# Patient Record
Sex: Male | Born: 1975 | Race: Black or African American | Hispanic: No | Marital: Single | Smoking: Current every day smoker
Health system: Southern US, Community
[De-identification: ages and names within clinical notes are randomized; demographics above are authoritative.]

---
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
## 1998-02-28 ENCOUNTER — Emergency Department (HOSPITAL_COMMUNITY): Admission: EM | Admit: 1998-02-28 | Discharge: 1998-02-28 | Payer: Self-pay | Admitting: Emergency Medicine

## 1998-03-13 ENCOUNTER — Emergency Department (HOSPITAL_COMMUNITY): Admission: EM | Admit: 1998-03-13 | Discharge: 1998-03-13 | Payer: Self-pay | Admitting: *Deleted

## 2010-12-24 ENCOUNTER — Inpatient Hospital Stay (INDEPENDENT_AMBULATORY_CARE_PROVIDER_SITE_OTHER)
Admission: RE | Admit: 2010-12-24 | Discharge: 2010-12-24 | Disposition: A | Discharge status: Home / Self Care | Payer: Managed Care, Other (non HMO) | Source: Ambulatory Visit | Attending: Family Medicine | Admitting: Family Medicine

## 2010-12-24 ENCOUNTER — Ambulatory Visit (INDEPENDENT_AMBULATORY_CARE_PROVIDER_SITE_OTHER): Payer: Managed Care, Other (non HMO)

## 2010-12-24 ENCOUNTER — Ambulatory Visit (HOSPITAL_COMMUNITY): Payer: Managed Care, Other (non HMO)

## 2010-12-24 DIAGNOSIS — S20229A Contusion of unspecified back wall of thorax, initial encounter: Secondary | ICD-10-CM

## 2013-12-13 ENCOUNTER — Emergency Department (INDEPENDENT_AMBULATORY_CARE_PROVIDER_SITE_OTHER): Payer: Managed Care, Other (non HMO)

## 2013-12-13 ENCOUNTER — Encounter (HOSPITAL_COMMUNITY): Payer: Self-pay | Admitting: Emergency Medicine

## 2013-12-13 ENCOUNTER — Emergency Department (INDEPENDENT_AMBULATORY_CARE_PROVIDER_SITE_OTHER)
Admission: EM | Admit: 2013-12-13 | Discharge: 2013-12-13 | Disposition: A | Discharge status: Home / Self Care | Payer: Managed Care, Other (non HMO) | Source: Home / Self Care | Attending: Family Medicine | Admitting: Family Medicine

## 2013-12-13 DIAGNOSIS — B349 Viral infection, unspecified: Secondary | ICD-10-CM

## 2013-12-13 DIAGNOSIS — R071 Chest pain on breathing: Secondary | ICD-10-CM

## 2013-12-13 DIAGNOSIS — B9789 Other viral agents as the cause of diseases classified elsewhere: Secondary | ICD-10-CM

## 2013-12-13 DIAGNOSIS — R0781 Pleurodynia: Secondary | ICD-10-CM

## 2013-12-13 MED ORDER — KETOROLAC TROMETHAMINE 60 MG/2ML IM SOLN
60.0000 mg | Freq: Once | INTRAMUSCULAR | Status: AC
  Administered 2013-12-13: 60 mg via INTRAMUSCULAR

## 2013-12-13 MED ORDER — KETOROLAC TROMETHAMINE 60 MG/2ML IM SOLN
INTRAMUSCULAR | Status: AC
  Filled 2013-12-13: qty 2

## 2013-12-13 MED ORDER — PREDNISONE (PAK) 10 MG PO TABS: ORAL_TABLET | Freq: Every day | ORAL | Status: DC

## 2013-12-13 NOTE — Discharge Instructions (Signed)
Pleurisy Pleurisy is an inflammation and swelling of the lining of the lungs (pleura). Because of this inflammation, it hurts to breathe. It can be aggravated by coughing, laughing, or deep breathing. Pleurisy is often caused by an underlying infection or disease.  HOME CARE INSTRUCTIONS  Monitor your pleurisy for any changes. The following actions may help to alleviate any discomfort you are experiencing:  Medicine may help with pain. Only take over-the-counter or prescription medicines for pain, discomfort, or fever as directed by your health care provider.  Only take antibiotic medicine as directed. Make sure to finish it even if you start to feel better. SEEK MEDICAL CARE IF:   Your pain is not controlled with medicine or is increasing.  You have an increase in pus-like (purulent) secretions brought up with coughing. SEEK IMMEDIATE MEDICAL CARE IF:   You have blue or dark lips, fingernails, or toenails.  You are coughing up blood.  You have increased difficulty breathing.  You have continuing pain unrelieved by medicine or pain lasting more than 1 week.  You have pain that radiates into your neck, arms, or jaw.  You develop increased shortness of breath or wheezing.  You develop a fever, rash, vomiting, fainting, or other serious symptoms. MAKE SURE YOU:  Understand these instructions.   Will watch your condition.   Will get help right away if you are not doing well or get worse.  Document Released: 10/10/2005 Document Revised: 06/12/2013 Document Reviewed: 03/24/2013 [facility] Patient Information 2014 [place], [place].   STart pred Pack tomorrow morning. REst and fluids. Should the pain worsen, then please f/u.

## 2013-12-13 NOTE — ED Provider Notes (Signed)
CSN: [id number]     Arrival date & time 12/13/13  1836 History   First MD Initiated Contact with Patient 12/13/13 1910     Chief Complaint  Patient presents with  . Chest Pain  . Shortness of Breath     (Consider location/radiation/quality/duration/timing/severity/associated sxs/prior Treatment) HPI Comments: Patient presents with inspiratory left sided chest pain. Described as "sharp". Mild SOB. No cough or congestion. No fever or chills. No prior history of Asthma. Previous cough, and nasal congestion over the last few days preceding. No N, V. No radicular pain. No weakness. No palpitations.   Patient is a 38 y.o. male presenting with chest pain and shortness of breath. The history is provided by the patient.  Chest Pain Associated symptoms: cough, fatigue and shortness of breath   Associated symptoms: no fever and no palpitations   Shortness of Breath Associated symptoms: chest pain, cough and wheezing   Associated symptoms: no fever     History reviewed. No pertinent past medical history. History reviewed. No pertinent past surgical history. No family history on file. History  Substance Use Topics  . Smoking status: Current Every Day Smoker  . Smokeless tobacco: Not on file  . Alcohol Use: Yes    Review of Systems  Constitutional: Positive for fatigue. Negative for fever and chills.  HENT: Positive for rhinorrhea.   Eyes: Negative.   Respiratory: Positive for cough, shortness of breath and wheezing. Negative for apnea and chest tightness.   Cardiovascular: Positive for chest pain. Negative for palpitations and leg swelling.  Gastrointestinal: Negative.   Genitourinary: Negative.   Skin: Negative.   Allergic/Immunologic: Negative.   Neurological: Negative.   Psychiatric/Behavioral: Negative.       Allergies  Review of patient's allergies indicates no known allergies.  Home Medications   Current Outpatient Rx  Name  Route  Sig  Dispense  Refill  . guaifenesin  (ROBITUSSIN) 100 MG/5ML syrup   Oral   Take 200 mg by mouth 3 (three) times daily as needed for cough.         . predniSONE (STERAPRED UNI-PAK) 10 MG tablet   Oral   Take by mouth daily. 10mg  12 day pack as directed   1 tablet   0    BP 102/55  Pulse 86  Temp(Src) 97.8 F (36.6 C) (Oral)  Resp 18  SpO2 100% Physical Exam  Nursing note and vitals reviewed. Constitutional: He is oriented to person, place, and time. He appears well-developed and well-nourished. No distress.  HENT:  Head: Normocephalic and atraumatic.  Mouth/Throat: No oropharyngeal exudate.  Eyes: Pupils are equal, round, and reactive to light.  Neck: Normal range of motion. Neck supple. No JVD present. No tracheal deviation present.  Cardiovascular: Normal rate, regular rhythm and normal heart sounds.  Exam reveals no gallop and no friction rub.   No murmur heard. Pulmonary/Chest: Effort normal. No respiratory distress. He has wheezes. He has no rales. He exhibits no tenderness.  Wheezes in the right base, and few rhonci  Abdominal: Soft. Bowel sounds are normal.  Lymphadenopathy:    He has no cervical adenopathy.  Neurological: He is alert and oriented to person, place, and time. No cranial nerve deficit.  Skin: He is not diaphoretic.    ED Course  Procedures (including critical care time) Labs Review Labs Reviewed - No data to display Imaging Review Dg Chest 2 View  12/13/2013   CLINICAL DATA:  Cough and left chest pain  EXAM: CHEST  2 VIEW  COMPARISON:  None.  FINDINGS: The heart size and mediastinal contours are within normal limits. Both lungs are clear. The visualized skeletal structures are unremarkable.  IMPRESSION: No active cardiopulmonary disease.   Electronically Signed   By: Ruel Favors M.D.   On: 12/13/2013 19:43      MDM   Final diagnoses:  Pleuritic chest pain  Viral syndrome    EKG-Normal, CXR-Normal. O2 sats 100%-Most likely pleuritic chest pain in the setting of a viral URI.  Educated. If worsens please return for evaluation.  Toradol given for acute pain followed by a Pred pack to start in the am.    Riki Sheer, PA-C 12/13/13 1958  Riki Sheer, PA-C 12/13/13 2010

## 2013-12-13 NOTE — ED Notes (Signed)
Patient has had a runny nose, cold symptoms recently.  Took robitussin.  After nap, woke with chest pain, around 4:30 pm.  Pain is sharp, intermittent.

## 2013-12-15 NOTE — ED Provider Notes (Signed)
Medical screening examination/treatment/procedure(s) were performed by a resident physician or non-physician practitioner and as the supervising physician I was immediately available for consultation/collaboration.  Clementeen GrahamEvan Jakyron Fabro, MD   Rodolph BongEvan S Tachina Spoonemore, MD 12/15/13 0830

## 2014-07-09 ENCOUNTER — Emergency Department (INDEPENDENT_AMBULATORY_CARE_PROVIDER_SITE_OTHER)
Admission: EM | Admit: 2014-07-09 | Discharge: 2014-07-09 | Disposition: A | Discharge status: Home / Self Care | Payer: Managed Care, Other (non HMO) | Source: Home / Self Care | Attending: Emergency Medicine | Admitting: Emergency Medicine

## 2014-07-09 ENCOUNTER — Encounter (HOSPITAL_COMMUNITY): Payer: Self-pay | Admitting: Emergency Medicine

## 2014-07-09 DIAGNOSIS — K529 Noninfective gastroenteritis and colitis, unspecified: Secondary | ICD-10-CM

## 2014-07-09 DIAGNOSIS — K5289 Other specified noninfective gastroenteritis and colitis: Secondary | ICD-10-CM

## 2014-07-09 LAB — POCT I-STAT, CHEM 8
BUN: 11 mg/dL (ref 6–23)
CALCIUM ION: 1.21 mmol/L (ref 1.12–1.23)
CHLORIDE: 104 meq/L (ref 96–112)
Creatinine, Ser: 1 mg/dL (ref 0.50–1.35)
Glucose, Bld: 103 mg/dL — ABNORMAL HIGH (ref 70–99)
HCT: 50 % (ref 39.0–52.0)
Hemoglobin: 17 g/dL (ref 13.0–17.0)
Potassium: 3.6 mEq/L — ABNORMAL LOW (ref 3.7–5.3)
Sodium: 138 mEq/L (ref 137–147)
TCO2: 28 mmol/L (ref 0–100)

## 2014-07-09 MED ORDER — ONDANSETRON 4 MG PO TBDP
ORAL_TABLET | ORAL | Status: AC
  Filled 2014-07-09: qty 2

## 2014-07-09 MED ORDER — ONDANSETRON 4 MG PO TBDP
8.0000 mg | ORAL_TABLET | Freq: Once | ORAL | Status: AC
  Administered 2014-07-09: 8 mg via ORAL

## 2014-07-09 MED ORDER — GI COCKTAIL ~~LOC~~
30.0000 mL | Freq: Once | ORAL | Status: AC
  Administered 2014-07-09: 30 mL via ORAL

## 2014-07-09 MED ORDER — SODIUM CHLORIDE 0.9 % IV SOLN
INTRAVENOUS | Status: DC
  Administered 2014-07-09: 10:00:00 via INTRAVENOUS

## 2014-07-09 MED ORDER — GI COCKTAIL ~~LOC~~
ORAL | Status: AC
  Filled 2014-07-09: qty 30

## 2014-07-09 MED ORDER — DICYCLOMINE HCL 20 MG PO TABS: 20.0000 mg | ORAL_TABLET | Freq: Two times a day (BID) | ORAL | Status: DC

## 2014-07-09 MED ORDER — DIPHENOXYLATE-ATROPINE 2.5-0.025 MG PO TABS: 1.0000 | ORAL_TABLET | Freq: Four times a day (QID) | ORAL | Status: DC | PRN

## 2014-07-09 MED ORDER — ONDANSETRON 8 MG PO TBDP: 8.0000 mg | ORAL_TABLET | Freq: Three times a day (TID) | ORAL | Status: DC | PRN

## 2014-07-09 NOTE — ED Provider Notes (Signed)
Chief Complaint   GI Problem    History of Present Illness   Tristan Barajas is a 38 year old male who has had a three-day history of nausea, vomiting, and diarrhea. He only vomited twice on the first day of illness and has not vomited since then. He's been able to keep down liquids and solids. He's had at least 5 bowel movements per day for the past 3 days and probably more. There is blood mixed in with the stool. He describes dime size blood clots. The patient states this actually was going on before he got sick and has actually been going on for many months. The patient states he is sometimes constipated and has to strain at stool. After straining he will notice a small amount of blood in the stool. Over the past 3 days he is also noted fatigue, anorexia, and crampy, mid to right abdominal pain. Everyone in the household, which includes 7 people and 2 sets of twins, have all been ill with nausea, vomiting, and diarrhea. The patient denies any suspicious ingestions.  Review of Systems   Other than as noted above, the patient denies any of the following symptoms: Systemic:  No fevers, chills, or dizziness. GI:  Blood in stool or vomitus. GU:  No dysuria, frequency, or urgency.  PMFSH   Past medical history, family history, social history, meds, and allergies were reviewed.    Physical Exam     Vital signs:  BP 129/89  Pulse 88  Temp(Src) 98.8 F (37.1 C) (Oral)  SpO2 97% General:  Alert and oriented.  In no distress.  Skin warm and dry.  Good skin turgor, brisk capillary refill. ENT:  No scleral icterus, moist mucous membranes, no oral lesions, pharynx clear. Lungs:  Breath sounds clear and equal bilaterally.  No wheezes, rales, or rhonchi. Heart:  Rhythm regular, without extrasystoles.  No gallops or murmers. Abdomen:  Soft, flat, nondistended. No organomegaly or mass. No tenderness, guarding, or rebound. Bowel sounds are hyperactive. Skin: Clear, warm, and dry.  Good turgor.   Brisk capillary refill.  Labs   Results for orders placed during the hospital encounter of 07/09/14  POCT I-STAT, CHEM 8      Result Value Ref Range   Sodium 138  137 - 147 mEq/L   Potassium 3.6 (*) 3.7 - 5.3 mEq/L   Chloride 104  96 - 112 mEq/L   BUN 11  6 - 23 mg/dL   Creatinine, Ser 4.09  0.50 - 1.35 mg/dL   Glucose, Bld 811 (*) 70 - 99 mg/dL   Calcium, Ion 9.14  7.82 - 1.23 mmol/L   TCO2 28  0 - 100 mmol/L   Hemoglobin 17.0  13.0 - 17.0 g/dL   HCT 95.6  21.3 - 08.6 %    Course in Urgent Care Center   The following medications were given:  Medications  0.9 %  sodium chloride infusion ( Intravenous New Bag/Given 07/09/14 0958)  ondansetron (ZOFRAN-ODT) disintegrating tablet 8 mg (8 mg Oral Given 07/09/14 0914)  gi cocktail (Maalox,Lidocaine,Donnatal) (30 mLs Oral Given 07/09/14 0914)    The patient states he felt a lot better after these medications and had no further nausea or vomiting while at the urgent care Center.  Assessment   The encounter diagnosis was Gastroenteritis.  He probably has a viral gastroenteritis. It's been going to the family. He also appears to have chronic, low volume hematochezia which will need further workup. Have recommended he followup with gastroenterology.  Plan  1.  Meds:  The following meds were prescribed:   New Prescriptions   DICYCLOMINE (BENTYL) 20 MG TABLET    Take 1 tablet (20 mg total) by mouth 2 (two) times daily.   DIPHENOXYLATE-ATROPINE (LOMOTIL) 2.5-0.025 MG PER TABLET    Take 1 tablet by mouth 4 (four) times daily as needed for diarrhea or loose stools.   ONDANSETRON (ZOFRAN ODT) 8 MG DISINTEGRATING TABLET    Take 1 tablet (8 mg total) by mouth every 8 (eight) hours as needed for nausea.    2.  Patient Education/Counseling:  The patient was given appropriate handouts, self care instructions, and instructed in symptomatic relief. The patient was told to stay on clear liquids for the remainder of the day, then advance to a  B.R.A.T. diet starting tomorrow.   3.  Follow up:  The patient was told to follow up here if no better in 2 to 3 days, or sooner if becoming worse in any way, and given some red flag symptoms such as persistent vomitng, high fever, severe abdominal pain, or any GI bleeding which would prompt immediate return.  Followup with Dr. Leone Payor in 2 or 3 weeks for the gastrointestinal bleeding.       Reuben Likes, MD 07/09/14 [phone]

## 2014-07-09 NOTE — Discharge Instructions (Signed)

## 2014-07-09 NOTE — ED Notes (Addendum)
7 people in home (2 sets of twins) everyone has been ill w n/v/d. Has not used any medication for problem; noted some blood in stool

## 2016-04-29 ENCOUNTER — Emergency Department (HOSPITAL_COMMUNITY)
Admission: EM | Admit: 2016-04-29 | Discharge: 2016-04-29 | Disposition: A | Discharge status: Home / Self Care | Payer: Managed Care, Other (non HMO) | Attending: Emergency Medicine | Admitting: Emergency Medicine

## 2016-04-29 ENCOUNTER — Emergency Department (HOSPITAL_COMMUNITY): Payer: Managed Care, Other (non HMO)

## 2016-04-29 ENCOUNTER — Encounter (HOSPITAL_COMMUNITY): Payer: Self-pay | Admitting: Emergency Medicine

## 2016-04-29 DIAGNOSIS — M79651 Pain in right thigh: Secondary | ICD-10-CM | POA: Insufficient documentation

## 2016-04-29 DIAGNOSIS — F17210 Nicotine dependence, cigarettes, uncomplicated: Secondary | ICD-10-CM | POA: Diagnosis not present

## 2016-04-29 DIAGNOSIS — Z79899 Other long term (current) drug therapy: Secondary | ICD-10-CM | POA: Diagnosis not present

## 2016-04-29 DIAGNOSIS — R52 Pain, unspecified: Secondary | ICD-10-CM

## 2016-04-29 LAB — CBC WITH DIFFERENTIAL/PLATELET
Basophils Absolute: 0 10*3/uL (ref 0.0–0.1)
Basophils Relative: 1 %
Eosinophils Absolute: 0.1 10*3/uL (ref 0.0–0.7)
Eosinophils Relative: 1 %
HCT: 37.2 % — ABNORMAL LOW (ref 39.0–52.0)
Hemoglobin: 12.9 g/dL — ABNORMAL LOW (ref 13.0–17.0)
Lymphocytes Relative: 26 %
Lymphs Abs: 2 10*3/uL (ref 0.7–4.0)
MCH: 31 pg (ref 26.0–34.0)
MCHC: 34.7 g/dL (ref 30.0–36.0)
MCV: 89.4 fL (ref 78.0–100.0)
Monocytes Absolute: 0.4 10*3/uL (ref 0.1–1.0)
Monocytes Relative: 5 %
Neutro Abs: 5.3 10*3/uL (ref 1.7–7.7)
Neutrophils Relative %: 67 %
Platelets: 360 10*3/uL (ref 150–400)
RBC: 4.16 MIL/uL — ABNORMAL LOW (ref 4.22–5.81)
RDW: 13.6 % (ref 11.5–15.5)
WBC: 7.9 10*3/uL (ref 4.0–10.5)

## 2016-04-29 LAB — BASIC METABOLIC PANEL
Anion gap: 8 (ref 5–15)
BUN: 16 mg/dL (ref 6–20)
CO2: 22 mmol/L (ref 22–32)
Calcium: 9.4 mg/dL (ref 8.9–10.3)
Chloride: 105 mmol/L (ref 101–111)
Creatinine, Ser: 0.96 mg/dL (ref 0.61–1.24)
GFR calc Af Amer: >60 mL/min (ref >60)
GFR calc non Af Amer: >60 mL/min (ref >60)
Glucose, Bld: 108 mg/dL — ABNORMAL HIGH (ref 65–99)
Potassium: 3.7 mmol/L (ref 3.5–5.1)
Sodium: 135 mmol/L (ref 135–145)

## 2016-04-29 LAB — CK: Total CK: 1027 U/L — ABNORMAL HIGH (ref 49–397)

## 2016-04-29 MED ORDER — SODIUM CHLORIDE 0.9 % IV BOLUS (SEPSIS)
1000.0000 mL | Freq: Once | INTRAVENOUS | Status: AC
  Administered 2016-04-29: 1000 mL via INTRAVENOUS

## 2016-04-29 MED ORDER — IBUPROFEN 800 MG PO TABS: 800.0000 mg | ORAL_TABLET | Freq: Three times a day (TID) | ORAL | Status: DC | PRN

## 2016-04-29 MED ORDER — SODIUM CHLORIDE 0.9 % IV BOLUS (SEPSIS): 1000.0000 mL | Freq: Once | INTRAVENOUS | Status: DC

## 2016-04-29 NOTE — Discharge Instructions (Signed)
Return here as needed.  Follow up with a primary care doctor °

## 2016-04-29 NOTE — Progress Notes (Signed)
[facility] ED CM noted pt with [employer] insurance coverage but no pcp listed   [facility] ED CM spoke with pt on how to obtain an in network pcp with insurance coverage via the customer service number or web site  Cm reviewed ED level of care for crisis/emergent services and community pcp level of care to manage continuous or chronic medical concerns.  The pt voiced understanding CM encouraged pt and discussed pt's responsibility to verify with pt's insurance carrier that any recommended medical provider offered by any emergency room or a hospital provider is within the carrier's network. The pt voiced understanding

## 2016-04-29 NOTE — ED Notes (Signed)
Pt reports intermittent numbness, burning, and hot to touch right leg pain; denies injury. Worse with resting.

## 2016-04-29 NOTE — ED Provider Notes (Signed)
CSN: [id number]     Arrival date & time 04/29/16  1144 History   First MD Initiated Contact with Patient 04/29/16 1149     Chief Complaint  Patient presents with  . Leg Pain     (Consider location/radiation/quality/duration/timing/severity/associated sxs/prior Treatment) HPI Patient presents to the emergency department with a 4 week history of tingling and burning sensation in his mid thigh anteriorly, mostly at night.  The patient states that seems to be related to the fact that his been sleeping on his father's couch.  Patient states that he does lift weights on a regular basis.  Patient states it seems to be worse at night when he is laying down. The patient denies chest pain, shortness of breath, headache,blurred vision, neck pain, fever, cough, weakness, numbness, dizziness, anorexia, edema, abdominal pain, nausea, vomiting, diarrhea, rash, back pain, dysuria, hematemesis, bloody stool, near syncope, or syncope. History reviewed. No pertinent past medical history. History reviewed. No pertinent past surgical history. No family history on file. Social History  Substance Use Topics  . Smoking status: Current Every Day Smoker -- 0.25 packs/day    Types: Cigarettes  . Smokeless tobacco: None  . Alcohol Use: No    Review of Systems All other systems negative except as documented in the HPI. All pertinent positives and negatives as reviewed in the HPI.   Allergies  Review of patient's allergies indicates no known allergies.  Home Medications   Prior to Admission medications   Medication Sig Start Date End Date Taking? Authorizing Provider  Multiple Vitamins-Minerals (MULTIVITAMIN WITH MINERALS) tablet Take 1 tablet by mouth daily.   Yes Historical Provider, MD  Protein POWD Take 2 scoop by mouth daily.   Yes Historical Provider, MD  Specialty Vitamins Products (ULTIMATE FAT BURNER PO) Take 2 tablets by mouth daily.   Yes Historical Provider, MD   BP 129/62 mmHg  Pulse 86   Temp(Src) 97.8 F (36.6 C) (Oral)  Resp 18  Ht 5\' 11"  (1.803 m)  Wt 102.513 kg  BMI 31.53 kg/m2  SpO2 98% Physical Exam  Constitutional: He is oriented to person, place, and time. He appears well-developed and well-nourished. No distress.  HENT:  Head: Normocephalic and atraumatic.  Mouth/Throat: Oropharynx is clear and moist.  Eyes: Pupils are equal, round, and reactive to light.  Neck: Normal range of motion. Neck supple.  Cardiovascular: Normal rate, regular rhythm and normal heart sounds.  Exam reveals no gallop and no friction rub.   No murmur heard. Pulmonary/Chest: Effort normal and breath sounds normal. No respiratory distress. He has no wheezes.  Abdominal: Soft. Bowel sounds are normal. He exhibits no distension. There is no tenderness.  Neurological: He is alert and oriented to person, place, and time. He exhibits normal muscle tone. Coordination normal.  Skin: Skin is warm and dry. No rash noted. No erythema.  Psychiatric: He has a normal mood and affect. His behavior is normal.  Nursing note and vitals reviewed.   ED Course  Procedures (including critical care time) Labs Review Labs Reviewed  CK - Abnormal; Notable for the following:    Total CK 1027 (*)    All other components within normal limits  BASIC METABOLIC PANEL - Abnormal; Notable for the following:    Glucose, Bld 108 (*)    All other components within normal limits  CBC WITH DIFFERENTIAL/PLATELET - Abnormal; Notable for the following:    RBC 4.16 (*)    Hemoglobin 12.9 (*)    HCT 37.2 (*)  All other components within normal limits    Imaging Review Dg Lumbar Spine Complete  04/29/2016  CLINICAL DATA:  Right thigh  pain EXAM: LUMBAR SPINE - COMPLETE 4+ VIEW COMPARISON:  12/24/2010 FINDINGS: Five views of the lumbar spine submitted. No acute fracture or subluxation. Stable minimal compression deformity in T12 vertebral body. IMPRESSION: No acute fracture or subluxation. Stable minimal compression  deformity T12 vertebral body. Electronically Signed   By: Natasha MeadLiviu  Pop M.D.   On: 04/29/2016 13:06   Dg Femur, Min 2 Views Right  04/29/2016  CLINICAL DATA:  Burning sensation and pain at RIGHT mid thigh today, no known injuries to the back or RIGHT femur EXAM: RIGHT FEMUR 2 VIEWS COMPARISON:  None FINDINGS: Osseous mineralization normal. Joint spaces preserved. No acute fracture, dislocation or bone destruction. Soft tissues radiographically unremarkable. Visualized RIGHT pelvis intact. IMPRESSION: Normal exam. Electronically Signed   By: Ulyses SouthwardMark  Boles M.D.   On: 04/29/2016 13:10   I have personally reviewed and evaluated these images and lab results as part of my medical decision-making.  The patient has no abnormalities noted on x-ray.  Advised patient this could be just some irritation of the musculature or of his lower back.  The patient agrees the plan and all questions were answered.  He is given follow-up.  Told to return here as needed.  No other symptoms are noted by the patient states that seems to be when he lays in one spot for too long as when he notices the tenderness    Charlestine NightChristopher Dezyre Hoefer, PA-C 05/01/16 2126  Mancel BaleElliott Wentz, MD 05/04/16 1439

## 2016-08-14 ENCOUNTER — Encounter (HOSPITAL_COMMUNITY): Payer: Self-pay | Admitting: Nurse Practitioner

## 2016-08-14 ENCOUNTER — Emergency Department (HOSPITAL_COMMUNITY)
Admission: EM | Admit: 2016-08-14 | Discharge: 2016-08-14 | Disposition: A | Discharge status: Home / Self Care | Payer: Managed Care, Other (non HMO) | Attending: Emergency Medicine | Admitting: Emergency Medicine

## 2016-08-14 DIAGNOSIS — K029 Dental caries, unspecified: Secondary | ICD-10-CM | POA: Diagnosis not present

## 2016-08-14 DIAGNOSIS — F17210 Nicotine dependence, cigarettes, uncomplicated: Secondary | ICD-10-CM | POA: Diagnosis not present

## 2016-08-14 DIAGNOSIS — K0889 Other specified disorders of teeth and supporting structures: Secondary | ICD-10-CM | POA: Diagnosis present

## 2016-08-14 MED ORDER — IBUPROFEN 800 MG PO TABS: 800.0000 mg | ORAL_TABLET | Freq: Three times a day (TID) | ORAL | 0 refills | Status: DC | PRN

## 2016-08-14 MED ORDER — PENICILLIN V POTASSIUM 500 MG PO TABS: 500.0000 mg | ORAL_TABLET | Freq: Four times a day (QID) | ORAL | 0 refills | Status: DC

## 2016-08-14 MED ORDER — HYDROCODONE-ACETAMINOPHEN 5-325 MG PO TABS: 1.0000 | ORAL_TABLET | Freq: Four times a day (QID) | ORAL | 0 refills | Status: DC | PRN

## 2016-08-14 MED ORDER — HYDROCODONE-ACETAMINOPHEN 5-325 MG PO TABS
2.0000 | ORAL_TABLET | Freq: Once | ORAL | Status: AC
  Administered 2016-08-14: 2 via ORAL
  Filled 2016-08-14: qty 2

## 2016-08-14 MED ORDER — IBUPROFEN 800 MG PO TABS
800.0000 mg | ORAL_TABLET | Freq: Once | ORAL | Status: AC
  Administered 2016-08-14: 800 mg via ORAL
  Filled 2016-08-14: qty 1

## 2016-08-14 MED ORDER — PENICILLIN V POTASSIUM 500 MG PO TABS
500.0000 mg | ORAL_TABLET | Freq: Once | ORAL | Status: AC
  Administered 2016-08-14: 500 mg via ORAL
  Filled 2016-08-14: qty 1

## 2016-08-14 NOTE — ED Triage Notes (Signed)
Pt c/o lower right side molar dental pain. Has been ongoing for a couple of weeks.

## 2016-08-14 NOTE — ED Provider Notes (Signed)
By signing my name below, I, Alyssa GroveMartin Green, attest that this documentation has been prepared under the direction and in the presence of Mosetta Ferdinand N Tyan Lasure, DO. Electronically Signed: Alyssa GroveMartin Green, ED Scribe. 08/14/16. 2:22 AM.  TIME SEEN: 2:20 AM   CHIEF COMPLAINT: Dental Pain  HPI: Tristan Barajas is a 40 y.o. male who presents to the Emergency Department complaining of gradual onset, intermittent lower right molar dental pain onset a few months, worse over the past several days. Pt states his tooth is chipped. Pt has taken extra strength Tylenol and Ibuprofen with no relief to pain. Denies fever. Pt states he has an appointment with a dentist on 08/15/2016.  ROS: See HPI Constitutional: no fever  Eyes: no drainage  ENT: no runny nose   Cardiovascular:  no chest pain  Resp: no SOB  GI: no vomiting GU: no dysuria Integumentary: no rash  Allergy: no hives  Musculoskeletal: no leg swelling  Neurological: no slurred speech ROS otherwise negative  PAST MEDICAL HISTORY/PAST SURGICAL HISTORY:  No past medical history on file.  MEDICATIONS:  Prior to Admission medications   Medication Sig Start Date End Date Taking? Authorizing Provider  ibuprofen (ADVIL,MOTRIN) 800 MG tablet Take 1 tablet (800 mg total) by mouth every 8 (eight) hours as needed. 04/29/16   Chase PicketJaime Pilcher Azarel Banner, PA-C  Multiple Vitamins-Minerals (MULTIVITAMIN WITH MINERALS) tablet Take 1 tablet by mouth daily.    Historical Provider, MD  Protein POWD Take 2 scoop by mouth daily.    Historical Provider, MD  Specialty Vitamins Products (ULTIMATE FAT BURNER PO) Take 2 tablets by mouth daily.    Historical Provider, MD    ALLERGIES:  No Known Allergies  SOCIAL HISTORY:  Social History  Substance Use Topics  . Smoking status: Current Every Day Smoker    Packs/day: 0.25    Types: Cigarettes  . Smokeless tobacco: Not on file  . Alcohol use No    FAMILY HISTORY: No family history on file.  EXAM: BP 130/64 (BP Location: Left  Arm)   Pulse 63   Temp 98.2 F (36.8 C) (Oral)   Resp 18   SpO2 100%  CONSTITUTIONAL: Alert and oriented and responds appropriately to questions. Well-appearing; well-nourished HEAD: Normocephalic EYES: Conjunctivae clear, PERRL ENT: normal nose; no rhinorrhea; moist mucous membranes; No pharyngeal erythema or petechiae, no tonsillar hypertrophy or exudate, no uvular deviation, no trismus or drooling, normal phonation, no stridor, multiple dental caries present, no drainable dental abscess noted, no Ludwig's angina, tongue sits flat in the bottom of the mouth, no angioedema, no facial erythema or warmth, no facial swelling NECK: Supple, no meningismus, no LAD  CARD: RRR; S1 and S2 appreciated; no murmurs, no clicks, no rubs, no gallops RESP: Normal chest excursion without splinting or tachypnea; breath sounds clear and equal bilaterally; no wheezes, no rhonchi, no rales, no hypoxia or respiratory distress, speaking full sentences ABD/GI: Normal bowel sounds; non-distended; soft, non-tender, no rebound, no guarding, no peritoneal signs BACK:  The back appears normal and is non-tender to palpation, there is no CVA tenderness EXT: Normal ROM in all joints; non-tender to palpation; no edema; normal capillary refill; no cyanosis, no calf tenderness or swelling    SKIN: Normal color for age and race; warm; no rash NEURO: Moves all extremities equally, sensation to light touch intact diffusely, cranial nerves II through XII intact PSYCH: The patient's mood and manner are appropriate. Grooming and personal hygiene are appropriate.  MEDICAL DECISION MAKING: The patient here with great second  lower molar dental pain. Does have a small area of dentin that appears to be exposed from DKA. No sign of drainable abscess or Ludwig's angina. No facial cellulitis. Will discharge on prophylactic penicillin and with a short course of pain medication. He has a dental appointment scheduled in less than 36 hours.  Discussed return precautions. He is comfortable with this plan.   At this time, I do not feel there is any life-threatening condition present. I have reviewed and discussed all results (EKG, imaging, lab, urine as appropriate), exam findings with patient/family. I have reviewed nursing notes and appropriate previous records.  I feel the patient is safe to be discharged home without further emergent workup and can continue workup as an outpatient as needed. Discussed usual and customary return precautions. Patient/family verbalize understanding and are comfortable with this plan.  Outpatient follow-up has been provided. All questions have been answered.  I personally performed the services described in this documentation, which was scribed in my presence. The recorded information has been reviewed and is accurate.     Layla Maw Tilman Mcclaren, DO 08/14/16 [phone]

## 2016-09-20 ENCOUNTER — Ambulatory Visit (INDEPENDENT_AMBULATORY_CARE_PROVIDER_SITE_OTHER): Payer: Managed Care, Other (non HMO) | Admitting: Physician Assistant

## 2016-09-20 VITALS — BP 110/62 | HR 78 | Temp 98.3°F | Resp 16 | Ht 70.0 in | Wt 199.0 lb

## 2016-09-20 DIAGNOSIS — R112 Nausea with vomiting, unspecified: Secondary | ICD-10-CM | POA: Diagnosis not present

## 2016-09-20 DIAGNOSIS — R197 Diarrhea, unspecified: Secondary | ICD-10-CM

## 2016-09-20 MED ORDER — ONDANSETRON 4 MG PO TBDP: 4.0000 mg | ORAL_TABLET | Freq: Three times a day (TID) | ORAL | 0 refills | Status: DC | PRN

## 2016-09-20 MED ORDER — ONDANSETRON 4 MG PO TBDP
4.0000 mg | ORAL_TABLET | Freq: Once | ORAL | Status: AC
  Administered 2016-09-20: 4 mg via ORAL

## 2016-09-20 NOTE — Progress Notes (Signed)
   Tristan PetitChico A Barajas  MRN: [id number] DOB: Dec 17, 1975  Subjective:  Pt presents to clinic with nausea with vomiting and diarrhea for the last 36h.  No one at home has been sick.  He ate at [employer] prior to his getting sick and he wonders if that might be what caused him to get ill.  He has vomited 2 times today and diarrhea 4 times today.  It is like water.  He have not been able to eat anything today yesterday when he tried to eat he vomited.  He has been able to keep down electrolytes fluids but he has been afraid to eat.    Review of Systems  Constitutional: Negative for chills and fever.  Gastrointestinal: Positive for abdominal pain (constant cramping), diarrhea, nausea and vomiting. Negative for blood in stool and constipation.  Neurological: Positive for headaches.   There are no active problems to display for this patient.  Current Outpatient Prescriptions on File Prior to Visit  Medication Sig Dispense Refill  . Protein POWD Take 2 scoop by mouth daily.    . Multiple Vitamins-Minerals (MULTIVITAMIN WITH MINERALS) tablet Take 1 tablet by mouth daily.     No current facility-administered medications on file prior to visit.     No Known Allergies  Pt patients past, family and social history were reviewed and updated.   Objective:  BP 110/62 (BP Location: Right Arm, Patient Position: Sitting, Cuff Size: Normal)   Pulse 78   Temp 98.3 F (36.8 C) (Oral)   Resp 16   Ht 5\' 10"  (1.778 m)   Wt 199 lb (90.3 kg)   SpO2 99%   BMI 28.55 kg/m   Physical Exam  Constitutional: He is oriented to person, place, and time and well-developed, well-nourished, and in no distress.  HENT:  Head: Normocephalic and atraumatic.  Right Ear: External ear normal.  Left Ear: External ear normal.  Eyes: Conjunctivae are normal.  Neck: Normal range of motion.  Cardiovascular: Normal rate, regular rhythm and normal heart sounds.   No murmur heard. Pulmonary/Chest: Effort normal and breath sounds  normal. He has no wheezes.  Abdominal: Soft. Bowel sounds are normal. There is no tenderness.  Neurological: He is alert and oriented to person, place, and time. Gait normal.  Skin: Skin is warm and dry.  Psychiatric: Mood, memory, affect and judgment normal.   Orthostatic VS for the past 24 hrs:  BP- Lying Pulse- Lying BP- Sitting Pulse- Sitting BP- Standing at 0 minutes Pulse- Standing at 0 minutes  09/20/16 1350 101/64 71 116/73 73 120/81 91      Assessment and Plan :  Non-intractable vomiting with nausea, unspecified vomiting type - Plan: Orthostatic vital signs, ondansetron (ZOFRAN-ODT) disintegrating tablet 4 mg, ondansetron (ZOFRAN ODT) 4 MG disintegrating tablet  Diarrhea, unspecified type  Pt likely has a stomach virus -  we will treat his symptoms and give him a note for work and if his diarrhea is not improved in 48h he will RTC for stool PCR pathogen testing.  He will continue to hydrate and advance diet as tolerated.  Benny LennertSarah Lylee Corrow PA-C  [facility] 09/20/2016 1:45 PM

## 2016-09-20 NOTE — Patient Instructions (Signed)
     IF you received an x-ray today, you will receive an invoice from [facility]. Please contact [facility] at [phone] with questions or concerns regarding your invoice.   IF you received labwork today, you will receive an invoice from [facility]. Please contact [facility] at [phone] with questions or concerns regarding your invoice.   Our billing staff will not be able to assist you with questions regarding bills from these companies.  You will be contacted with the lab results as soon as they are available. The fastest way to get your results is to activate your My Chart account. Instructions are located on the last page of this paperwork. If you have not heard from us regarding the results in 2 weeks, please contact this office.      

## 2017-03-16 ENCOUNTER — Encounter (HOSPITAL_COMMUNITY): Payer: Self-pay | Admitting: *Deleted

## 2017-03-16 ENCOUNTER — Emergency Department (HOSPITAL_COMMUNITY): Payer: 59

## 2017-03-16 ENCOUNTER — Emergency Department (HOSPITAL_COMMUNITY)
Admission: EM | Admit: 2017-03-16 | Discharge: 2017-03-16 | Disposition: A | Discharge status: Home / Self Care | Payer: 59 | Attending: Emergency Medicine | Admitting: Emergency Medicine

## 2017-03-16 DIAGNOSIS — F17210 Nicotine dependence, cigarettes, uncomplicated: Secondary | ICD-10-CM | POA: Diagnosis not present

## 2017-03-16 DIAGNOSIS — Z7952 Long term (current) use of systemic steroids: Secondary | ICD-10-CM | POA: Insufficient documentation

## 2017-03-16 DIAGNOSIS — R05 Cough: Secondary | ICD-10-CM | POA: Diagnosis present

## 2017-03-16 DIAGNOSIS — J069 Acute upper respiratory infection, unspecified: Secondary | ICD-10-CM

## 2017-03-16 DIAGNOSIS — B9789 Other viral agents as the cause of diseases classified elsewhere: Secondary | ICD-10-CM

## 2017-03-16 LAB — COMPREHENSIVE METABOLIC PANEL
ALBUMIN: 4.2 g/dL (ref 3.5–5.0)
ALK PHOS: 46 U/L (ref 38–126)
ALT: 17 U/L (ref 17–63)
AST: 26 U/L (ref 15–41)
Anion gap: 7 (ref 5–15)
BILIRUBIN TOTAL: 0.6 mg/dL (ref 0.3–1.2)
BUN: 12 mg/dL (ref 6–20)
CALCIUM: 9.1 mg/dL (ref 8.9–10.3)
CO2: 29 mmol/L (ref 22–32)
CREATININE: 1.08 mg/dL (ref 0.61–1.24)
Chloride: 101 mmol/L (ref 101–111)
GFR calc Af Amer: >60 mL/min (ref >60)
GFR calc non Af Amer: >60 mL/min (ref >60)
GLUCOSE: 95 mg/dL (ref 65–99)
Potassium: 3.8 mmol/L (ref 3.5–5.1)
Sodium: 137 mmol/L (ref 135–145)
TOTAL PROTEIN: 7.5 g/dL (ref 6.5–8.1)

## 2017-03-16 LAB — CBC WITH DIFFERENTIAL/PLATELET
BASOS ABS: 0 10*3/uL (ref 0.0–0.1)
BASOS PCT: 1 %
EOS PCT: 1 %
Eosinophils Absolute: 0 10*3/uL (ref 0.0–0.7)
HCT: 42.4 % (ref 39.0–52.0)
Hemoglobin: 13.9 g/dL (ref 13.0–17.0)
Lymphocytes Relative: 24 %
Lymphs Abs: 1.2 10*3/uL (ref 0.7–4.0)
MCH: 30.3 pg (ref 26.0–34.0)
MCHC: 32.8 g/dL (ref 30.0–36.0)
MCV: 92.4 fL (ref 78.0–100.0)
MONO ABS: 0.4 10*3/uL (ref 0.1–1.0)
Monocytes Relative: 8 %
Neutro Abs: 3.2 10*3/uL (ref 1.7–7.7)
Neutrophils Relative %: 66 %
PLATELETS: 266 10*3/uL (ref 150–400)
RBC: 4.59 MIL/uL (ref 4.22–5.81)
RDW: 13.4 % (ref 11.5–15.5)
WBC: 4.8 10*3/uL (ref 4.0–10.5)

## 2017-03-16 LAB — RAPID STREP SCREEN (MED CTR MEBANE ONLY): STREPTOCOCCUS, GROUP A SCREEN (DIRECT): NEGATIVE

## 2017-03-16 LAB — I-STAT CG4 LACTIC ACID, ED: Lactic Acid, Venous: 0.88 mmol/L (ref 0.5–1.9)

## 2017-03-16 MED ORDER — SODIUM CHLORIDE 0.9 % IV BOLUS (SEPSIS)
1000.0000 mL | Freq: Once | INTRAVENOUS | Status: AC
  Administered 2017-03-16: 1000 mL via INTRAVENOUS

## 2017-03-16 MED ORDER — KETOROLAC TROMETHAMINE 30 MG/ML IJ SOLN
30.0000 mg | Freq: Once | INTRAMUSCULAR | Status: AC
  Administered 2017-03-16: 30 mg via INTRAVENOUS
  Filled 2017-03-16: qty 1

## 2017-03-16 MED ORDER — ONDANSETRON HCL 4 MG/2ML IJ SOLN
4.0000 mg | Freq: Once | INTRAMUSCULAR | Status: AC
  Administered 2017-03-16: 4 mg via INTRAVENOUS
  Filled 2017-03-16: qty 2

## 2017-03-16 NOTE — ED Triage Notes (Signed)
Pt reports that since Monday at 7pm pt has not been feeling good.  Pt reports dizziness and generalized aches.  Pt feels like he has no energy.  Pt reports one episode of emesis on Monday but pt has been able to drink water and gatorade without emesis today.  Pt's father was recently admitted for a respiratory disease and pt has been around father.  Pt a/o x 4 and ambulatory.  Pt endorses chills and nausea.

## 2017-03-16 NOTE — ED Notes (Signed)
RN  At bedside placing IV and collecting labs.

## 2017-03-16 NOTE — ED Provider Notes (Signed)
[facility]-EMERGENCY DEPT Provider Note   CSN: [id number] Arrival date & time: 03/16/17  1730     History   Chief Complaint No chief complaint on file.   HPI Tristan Barajas is a 41 y.o. male.  41 year old male who presents with malaise, cough, and fatigue. 3 days ago, the patient began not feeling well with dizziness, generalized aches, and decreased energy. Since then he has had a cough associated with nasal congestion, sore throat, chills, subjective fevers, nausea, one episode of vomiting 2 days ago. He also reports intermittent headache. He has had decreased appetite. Normal urination. He has taken Tylenol, NyQuil, and allergy medication with no relief of his symptoms. He feels dehydrated. He does report sick contacts with father being ill recently as well as contact with children who have viral illness. No diarrhea. He reports some chest pain but only with coughing.   The history is provided by the patient.    History reviewed. No pertinent past medical history.  There are no active problems to display for this patient.   Past Surgical History:  Procedure Laterality Date  . ANKLE SURGERY Right 1997       Home Medications    Prior to Admission medications   Medication Sig Start Date End Date Taking? Authorizing Provider  acetaminophen (TYLENOL) 500 MG tablet Take 500 mg by mouth every 6 (six) hours as needed for moderate pain.   Yes [provider]  cetirizine (ZYRTEC) 10 MG tablet Take 10 mg by mouth daily as needed for allergies.   Yes [provider]  ondansetron (ZOFRAN ODT) 4 MG disintegrating tablet Take 1 tablet (4 mg total) by mouth every 8 (eight) hours as needed for nausea. Patient not taking: Reported on 03/16/2017 09/20/16   Morrell Riddle, PA-C    Family History No family history on file.  Social History Social History  Substance Use Topics  . Smoking status: Current Every Day Smoker    Packs/day: 0.25    Types: Cigarettes  . Smokeless  tobacco: Never Used  . Alcohol use No     Allergies   Patient has no known allergies.   Review of Systems Review of Systems All other systems reviewed and are negative except that which was mentioned in HPI  Physical Exam Updated Vital Signs BP (!) 106/51 (BP Location: Left Arm)   Pulse 67   Temp 99.4 F (37.4 C)   Resp 18   Ht 5\' 11"  (1.803 m)   Wt 94.8 kg (209 lb)   SpO2 95%   BMI 29.15 kg/m   Physical Exam  Constitutional: He is oriented to person, place, and time. He appears well-developed and well-nourished. No distress.  HENT:  Head: Normocephalic and atraumatic.  Erythema b/l tonsils with mild enlargement, no asymmetry, uvula midline, no tonsillar exudate  Eyes: Conjunctivae are normal. Pupils are equal, round, and reactive to light.  Neck: Neck supple.  Cardiovascular: Normal rate, regular rhythm and normal heart sounds.   No murmur heard. Pulmonary/Chest: Effort normal and breath sounds normal.  Abdominal: Soft. Bowel sounds are normal. He exhibits no distension. There is no tenderness.  Musculoskeletal: He exhibits no edema.  Neurological: He is alert and oriented to person, place, and time.  Fluent speech  Skin: Skin is warm and dry. No rash noted.  Psychiatric: He has a normal mood and affect. Judgment normal.  Nursing note and vitals reviewed.    ED Treatments / Results  Labs (all labs ordered are listed, but only  abnormal results are displayed) Labs Reviewed  RAPID STREP SCREEN (NOT AT [facility])  CULTURE, GROUP A STREP [facility])  COMPREHENSIVE METABOLIC PANEL  CBC WITH DIFFERENTIAL/PLATELET  I-STAT CG4 LACTIC ACID, ED    EKG  EKG Interpretation None       Radiology Dg Chest 2 View  Result Date: 03/16/2017 CLINICAL DATA:  Acute onset of cough, fatigue and chills. Generalized chest pain. Initial encounter. EXAM: CHEST  2 VIEW COMPARISON:  Chest radiograph performed 12/13/2013 FINDINGS: The lungs are well-aerated and clear. There is no evidence  of focal opacification, pleural effusion or pneumothorax. The heart is normal in size; the mediastinal contour is within normal limits. No acute osseous abnormalities are seen. IMPRESSION: No acute cardiopulmonary process seen. Electronically Signed   By: Roanna RaiderJeffery  Chang M.D.   On: 03/16/2017 18:35    Procedures Procedures (including critical care time)  Medications Ordered in ED Medications  sodium chloride 0.9 % bolus 1,000 mL (0 mLs Intravenous Stopped 03/16/17 1937)  ondansetron (ZOFRAN) injection 4 mg (4 mg Intravenous Given 03/16/17 1840)  ketorolac (TORADOL) 30 MG/ML injection 30 mg (30 mg Intravenous Given 03/16/17 1936)     Initial Impression / Assessment and Plan / ED Course  I have reviewed the triage vital signs and the nursing notes.  Pertinent labs & imaging results that were available during my care of the patient were reviewed by me and considered in my medical decision making (see chart for details).     PT w/ several days of viral symptoms. He was nontoxic on exam with normal vital signs. Clear breath sounds, mild erythema in throat with no tonsillar asymmetry. Obtained screening labs which were unremarkable and showed no evidence of dehydration and normal blood counts. Chest x-ray negative acute. Gave the patient IV fluids, Zofran, and Toradol for his symptoms. His symptoms are consistent with a viral syndrome and he is otherwise well-appearing, well-hydrated, and with reassuring vital signs. I feel he is safe for discharge with supportive measures. I have discussed supportive care at home and reviewed return precautions. Patient discharged in satisfactory condition.  Final Clinical Impressions(s) / ED Diagnoses   Final diagnoses:  Viral URI with cough    New Prescriptions Discharge Medication List as of 03/16/2017  8:41 PM       Toniette Devera, Ambrose Finlandachel Morgan, MD 03/16/17 2339

## 2017-03-20 LAB — CULTURE, GROUP A STREP (THRC)

## 2018-05-21 IMAGING — CR DG CHEST 2V
2 series · 2 of 2 positions shown · non-contrast
Comparison: Chest radiograph performed 12/13/2013

CLINICAL DATA: Acute onset of cough, fatigue and chills.
Generalized chest pain. Initial encounter.

EXAM:
CHEST  2 VIEW

[w chest pa]
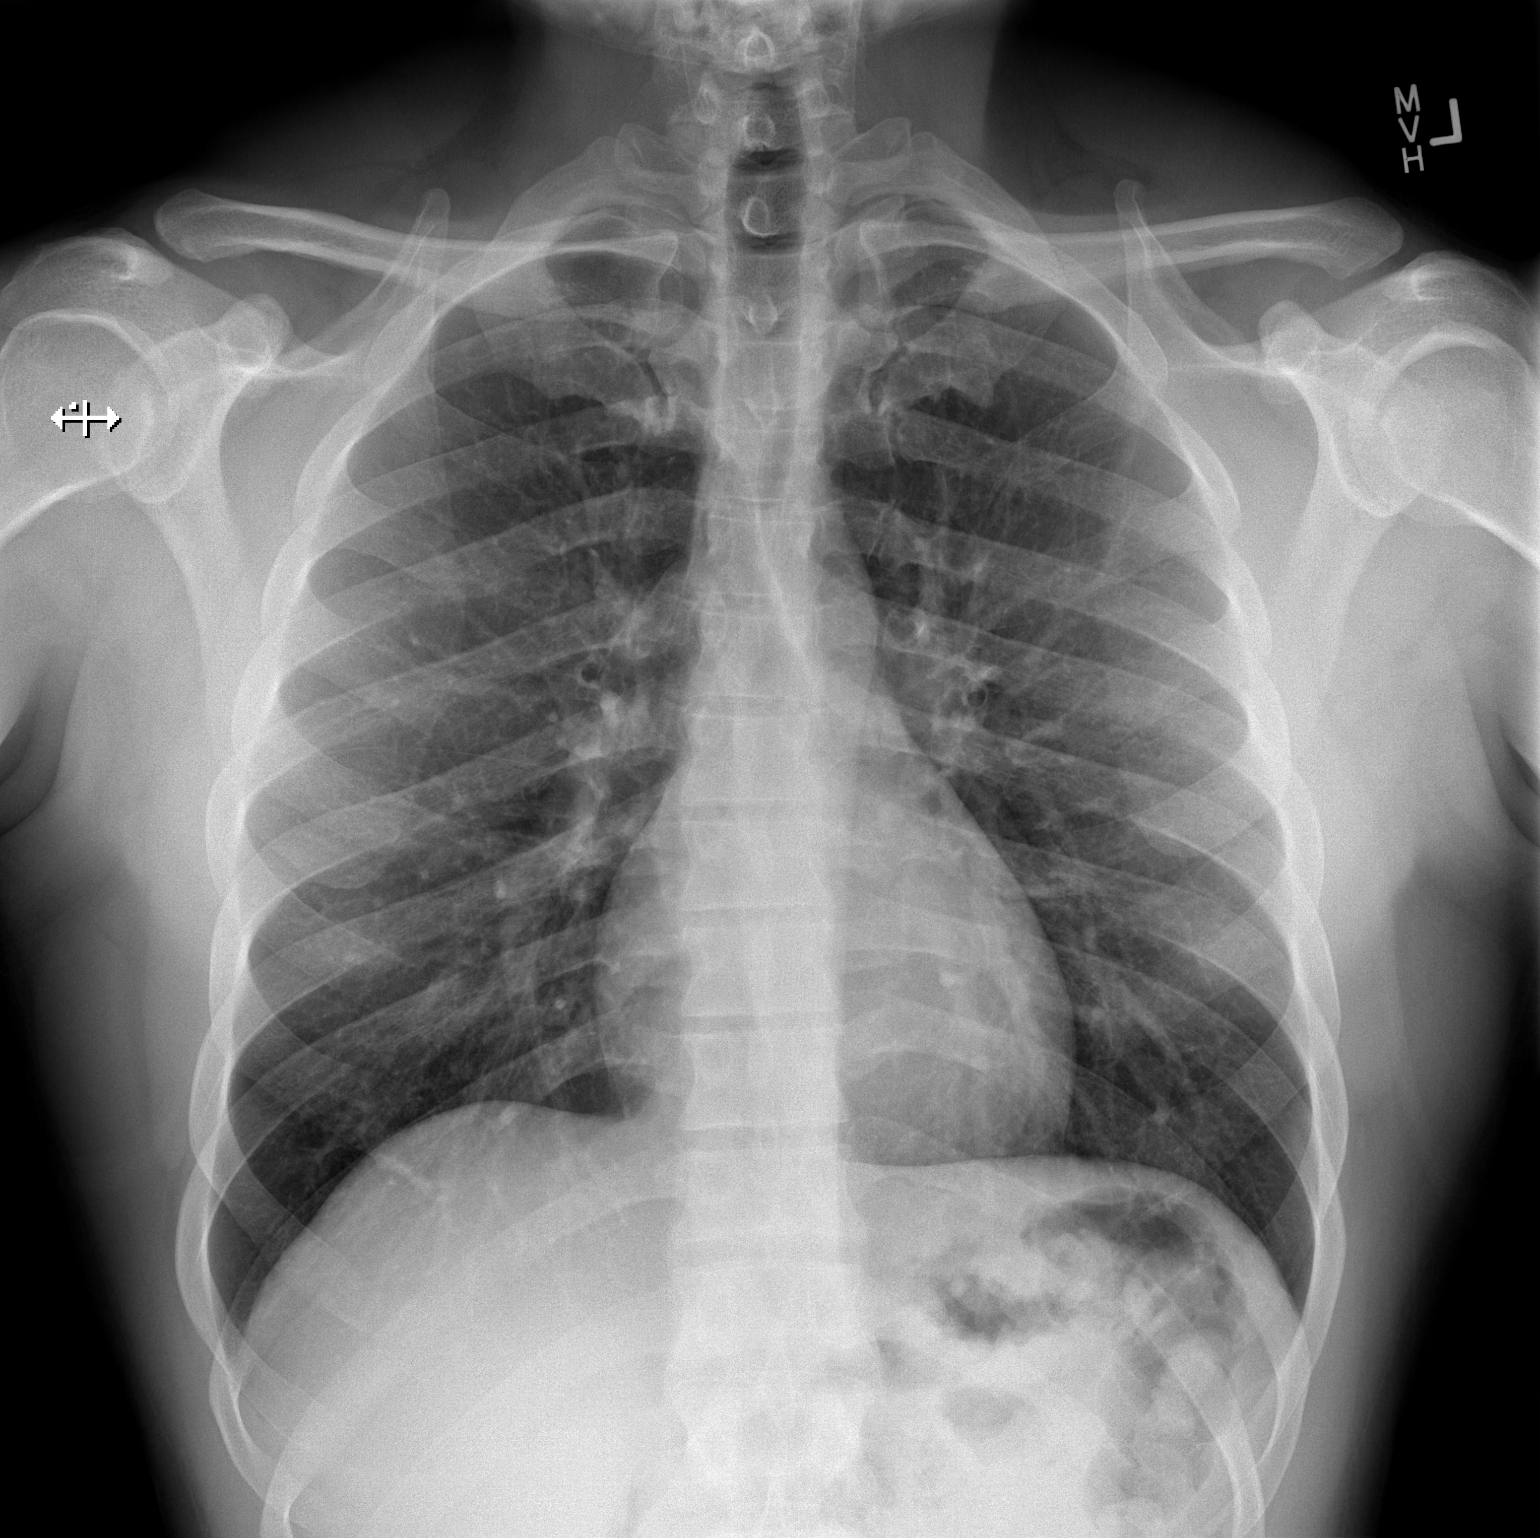

[w chest lat]
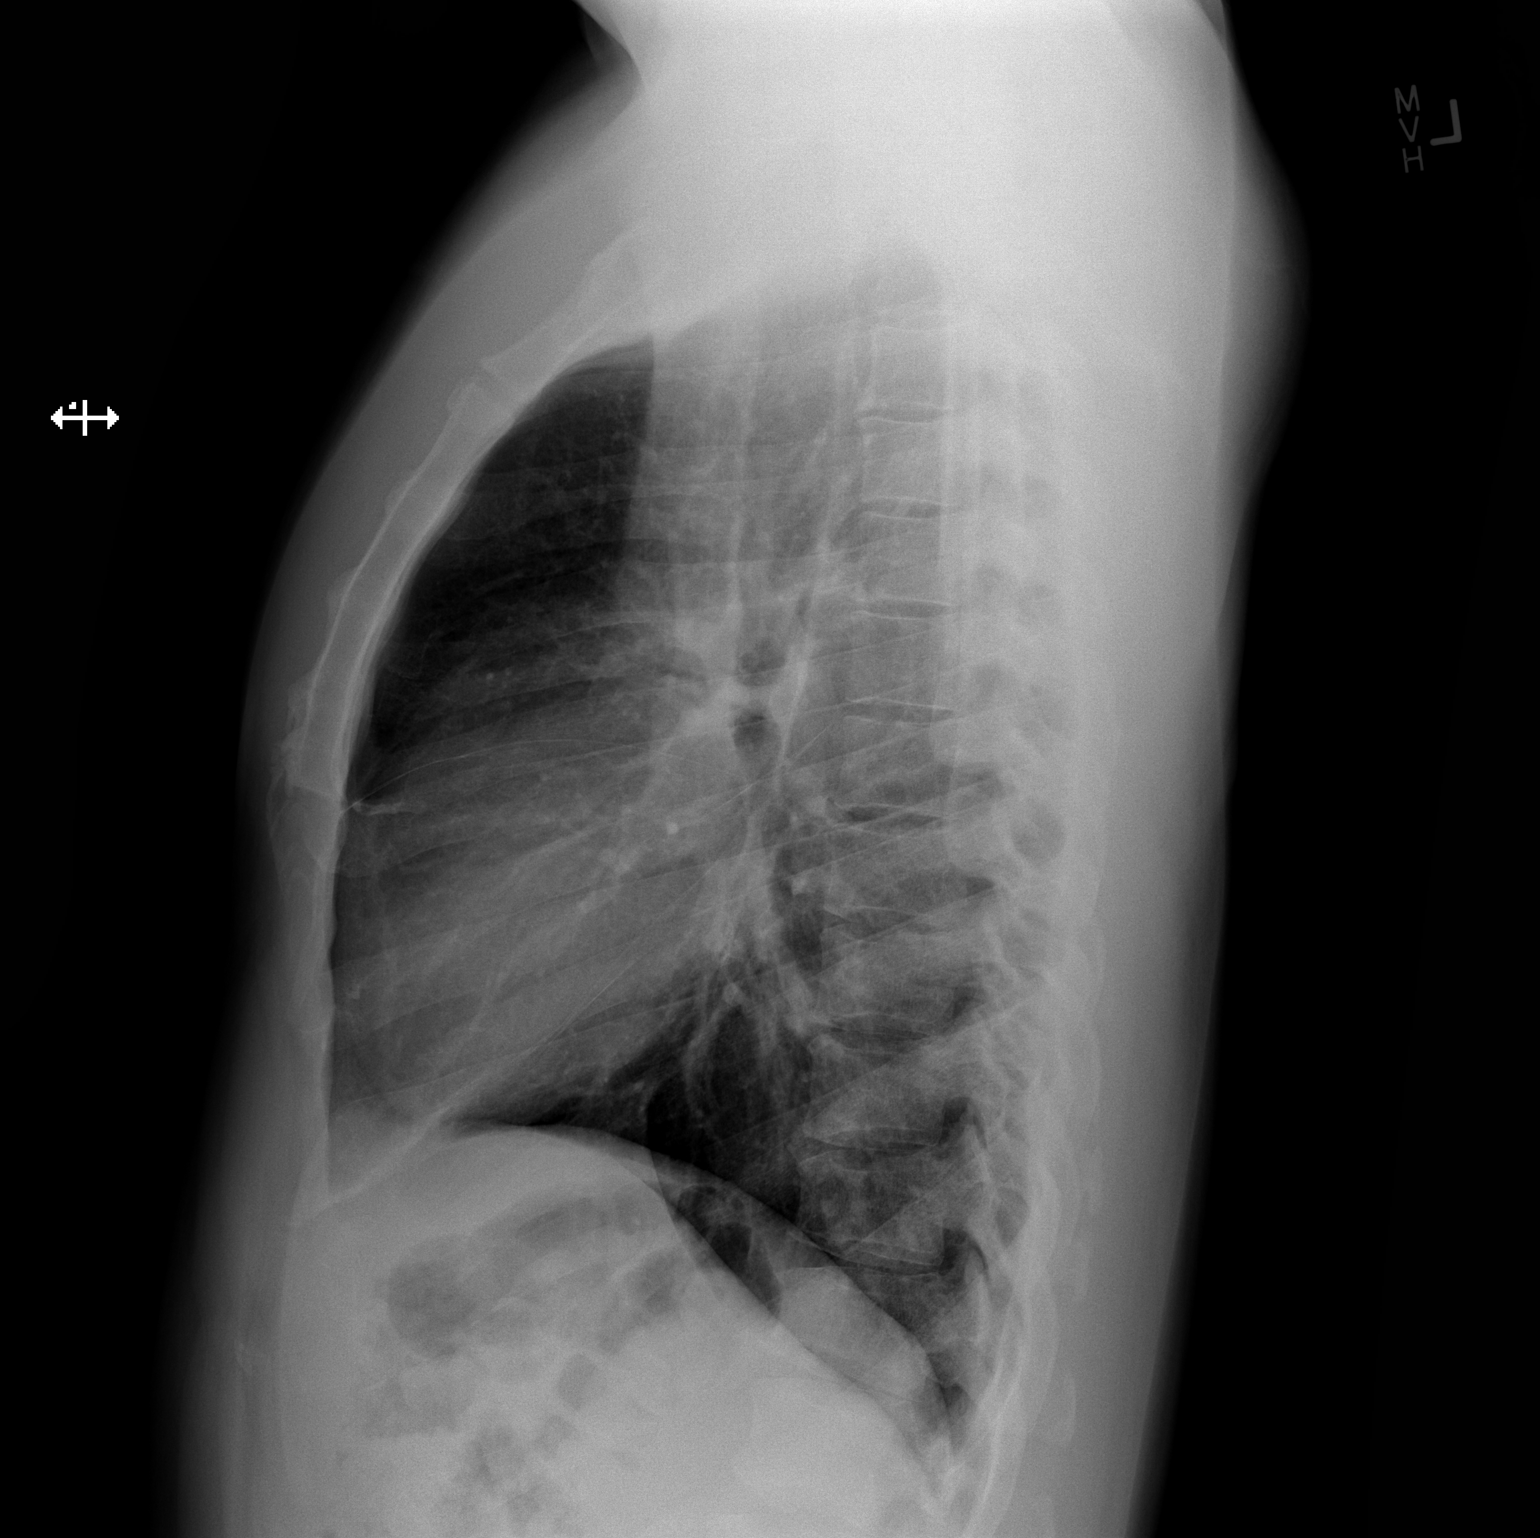

[2 of 2 positions shown; findings below may reference images not displayed]

FINDINGS: The lungs are well-aerated and clear. There is no evidence of focal
opacification, pleural effusion or pneumothorax.

The heart is normal in size; the mediastinal contour is within
normal limits. No acute osseous abnormalities are seen.
IMPRESSION: No acute cardiopulmonary process seen.

## 2019-12-02 ENCOUNTER — Other Ambulatory Visit: Payer: Self-pay

## 2019-12-02 ENCOUNTER — Emergency Department (HOSPITAL_BASED_OUTPATIENT_CLINIC_OR_DEPARTMENT_OTHER): Payer: BC Managed Care – PPO

## 2019-12-02 ENCOUNTER — Emergency Department (HOSPITAL_BASED_OUTPATIENT_CLINIC_OR_DEPARTMENT_OTHER)
Admission: EM | Admit: 2019-12-02 | Discharge: 2019-12-02 | Disposition: A | Discharge status: Home / Self Care | Payer: BC Managed Care – PPO | Attending: Emergency Medicine | Admitting: Emergency Medicine

## 2019-12-02 ENCOUNTER — Encounter (HOSPITAL_BASED_OUTPATIENT_CLINIC_OR_DEPARTMENT_OTHER): Payer: Self-pay

## 2019-12-02 DIAGNOSIS — F17210 Nicotine dependence, cigarettes, uncomplicated: Secondary | ICD-10-CM | POA: Insufficient documentation

## 2019-12-02 DIAGNOSIS — R197 Diarrhea, unspecified: Secondary | ICD-10-CM | POA: Diagnosis not present

## 2019-12-02 DIAGNOSIS — R059 Cough, unspecified: Secondary | ICD-10-CM

## 2019-12-02 DIAGNOSIS — Z20822 Contact with and (suspected) exposure to covid-19: Secondary | ICD-10-CM | POA: Insufficient documentation

## 2019-12-02 DIAGNOSIS — R05 Cough: Secondary | ICD-10-CM | POA: Diagnosis present

## 2019-12-02 LAB — SARS CORONAVIRUS 2 AG (30 MIN TAT): SARS Coronavirus 2 Ag: NEGATIVE

## 2019-12-02 NOTE — ED Triage Notes (Signed)
Pt reports loss of taste, cough, sob past few days, covid positive family member last week

## 2019-12-02 NOTE — Discharge Instructions (Signed)
Please quarantine until you get the results of your COVID test which could take 2-3 days Take Tylenol or Ibuprofen as needed for fever or headache Rest and stay hydrated You can take pepto-bismol or imodium for diarrhea Please return if you are worsening

## 2019-12-02 NOTE — ED Provider Notes (Signed)
[facility] EMERGENCY DEPARTMENT Provider Note   CSN: [id number] Arrival date & time: 12/02/19  8850   History Chief Complaint  Patient presents with  . Cough    Tristan Barajas is a 44 y.o. male who presents for COVID testing. Pt states that symptoms started 2-3 days ago. Saturday morning he woke up and noticed he doesn't feel well and loss his taste. He also reports a "migraine", runny nose, scratchy throat, diaphoresis, SOB when lying down at night, dry cough, some diarrhea. He states he was contacted by his job to tell him he was around someone with [employer]. He also notes that his daughter had COVID a couple weeks ago. He did go to [facility] in mid Jan to be COVID tested and it was negative at that time. He denies fever, chills, body aches, chest pain, abdominal pain, N/V. He states he came to get tested again today because he has an infant at home and wants to be sure.  HPI     History reviewed. No pertinent past medical history.  There are no problems to display for this patient.   Past Surgical History:  Procedure Laterality Date  . ANKLE SURGERY Right 1997       History reviewed. No pertinent family history.  Social History   Tobacco Use  . Smoking status: Current Every Day Smoker    Packs/day: 0.25    Types: Cigarettes  . Smokeless tobacco: Never Used  Substance Use Topics  . Alcohol use: No  . Drug use: No    Home Medications Prior to Admission medications   Medication Sig Start Date End Date Taking? Authorizing Provider  acetaminophen (TYLENOL) 500 MG tablet Take 500 mg by mouth every 6 (six) hours as needed for moderate pain.    [provider]  cetirizine (ZYRTEC) 10 MG tablet Take 10 mg by mouth daily as needed for allergies.    [provider]  ondansetron (ZOFRAN ODT) 4 MG disintegrating tablet Take 1 tablet (4 mg total) by mouth every 8 (eight) hours as needed for nausea. Patient not taking: Reported on 03/16/2017 09/20/16   Mancel Bale, PA-C    Allergies    Patient has no known allergies.  Review of Systems   Review of Systems  Constitutional: Positive for diaphoresis. Negative for chills and fever.  HENT: Positive for rhinorrhea. Negative for sore throat and trouble swallowing.   Respiratory: Positive for cough and shortness of breath. Negative for wheezing.   Cardiovascular: Negative for chest pain.  Gastrointestinal: Positive for diarrhea. Negative for abdominal pain, nausea and vomiting.  Neurological: Positive for headaches.    Physical Exam Updated Vital Signs BP 130/78 (BP Location: Right Arm)   Pulse 78   Temp 98.4 F (36.9 C) (Oral)   Resp 18   Ht 5\' 11"  (1.803 m)   Wt 97.5 kg   SpO2 100%   BMI 29.99 kg/m   Physical Exam Vitals and nursing note reviewed.  Constitutional:      General: He is not in acute distress.    Appearance: He is well-developed.     Comments: Calm and cooperative. NAD  HENT:     Head: Normocephalic and atraumatic.     Right Ear: Tympanic membrane normal.     Left Ear: Tympanic membrane normal.     Nose: Nose normal.     Mouth/Throat:     Mouth: Mucous membranes are moist.  Eyes:     General: No scleral icterus.  Right eye: No discharge.        Left eye: No discharge.     Conjunctiva/sclera: Conjunctivae normal.     Pupils: Pupils are equal, round, and reactive to light.  Cardiovascular:     Rate and Rhythm: Normal rate and regular rhythm.  Pulmonary:     Effort: Pulmonary effort is normal. No respiratory distress.     Breath sounds: Normal breath sounds.  Abdominal:     General: There is no distension.     Palpations: Abdomen is soft.     Tenderness: There is no abdominal tenderness.  Musculoskeletal:     Cervical back: Normal range of motion.  Skin:    General: Skin is warm and dry.  Neurological:     Mental Status: He is alert and oriented to person, place, and time.  Psychiatric:        Behavior: Behavior normal.     ED Results /  Procedures / Treatments   Labs (all labs ordered are listed, but only abnormal results are displayed) Labs Reviewed  SARS CORONAVIRUS 2 AG (30 MIN TAT)  NOVEL CORONAVIRUS, NAA (HOSP ORDER, SEND-OUT TO REF LAB; TAT 18-24 HRS)    EKG None  Radiology DG Chest Portable 1 View  Result Date: 12/02/2019 CLINICAL DATA:  Cough and shortness of breath. EXAM: PORTABLE CHEST 1 VIEW COMPARISON:  03/16/2017 FINDINGS: The cardiac silhouette is within normal limits given the AP projection and portable technique. The mediastinal and hilar contours are normal. The lungs are clear. No infiltrates or effusions. The bony thorax is intact. IMPRESSION: No acute cardiopulmonary findings. Electronically Signed   By: Rudie Meyer M.D.   On: 12/02/2019 09:57    Procedures Procedures (including critical care time)  Medications Ordered in ED Medications - No data to display  ED Course  I have reviewed the triage vital signs and the nursing notes.  Pertinent labs & imaging results that were available during my care of the patient were reviewed by me and considered in my medical decision making (see chart for details).  44 year old male presents with multiple symptoms concerning for COVID-19.  He also has had several recent exposures.  His vital signs are normal here.  He is overall well-appearing and exam is unremarkable.  Will obtain rapid Covid and chest x-ray due to reported shortness of breath at nighttime.  Rapid Covid is negative and chest x-ray is clear.  We will send off PCR test due to high suspicion for COVID and advised to quarantine at home until he gets the results.  He is advised to return if worsening.  He verbalized understanding.  Tristan Barajas was evaluated in Emergency Department on 12/02/2019 for the symptoms described in the history of present illness. He was evaluated in the context of the global COVID-19 pandemic, which necessitated consideration that the patient might be at risk for  infection with the SARS-CoV-2 virus that causes COVID-19. Institutional protocols and algorithms that pertain to the evaluation of patients at risk for COVID-19 are in a state of rapid change based on information released by regulatory bodies including the CDC and federal and state organizations. These policies and algorithms were followed during the patient's care in the ED.   MDM Rules/Calculators/A&P                       Final Clinical Impression(s) / ED Diagnoses Final diagnoses:  Exposure to COVID-19 virus  Cough  Diarrhea, unspecified type    Rx /  DC Orders ED Discharge Orders    None       Beryle Quant 12/02/19 1044    Little, Ambrose Finland, MD 12/02/19 1049

## 2019-12-04 LAB — NOVEL CORONAVIRUS, NAA (HOSP ORDER, SEND-OUT TO REF LAB; TAT 18-24 HRS): SARS-CoV-2, NAA: NOT DETECTED

## 2021-06-24 ENCOUNTER — Other Ambulatory Visit: Payer: Self-pay

## 2021-06-24 ENCOUNTER — Ambulatory Visit: Payer: Self-pay

## 2021-06-24 ENCOUNTER — Other Ambulatory Visit: Payer: Self-pay | Admitting: Family Medicine

## 2021-06-24 DIAGNOSIS — Z0000 Encounter for general adult medical examination without abnormal findings: Secondary | ICD-10-CM

## 2021-10-10 ENCOUNTER — Other Ambulatory Visit: Payer: Self-pay

## 2021-10-10 ENCOUNTER — Emergency Department (HOSPITAL_BASED_OUTPATIENT_CLINIC_OR_DEPARTMENT_OTHER): Payer: BC Managed Care – PPO

## 2021-10-10 ENCOUNTER — Emergency Department (HOSPITAL_BASED_OUTPATIENT_CLINIC_OR_DEPARTMENT_OTHER)
Admission: EM | Admit: 2021-10-10 | Discharge: 2021-10-10 | Disposition: A | Discharge status: Home / Self Care | Payer: BC Managed Care – PPO | Attending: Emergency Medicine | Admitting: Emergency Medicine

## 2021-10-10 ENCOUNTER — Encounter (HOSPITAL_BASED_OUTPATIENT_CLINIC_OR_DEPARTMENT_OTHER): Payer: Self-pay

## 2021-10-10 DIAGNOSIS — R5383 Other fatigue: Secondary | ICD-10-CM

## 2021-10-10 DIAGNOSIS — M25531 Pain in right wrist: Secondary | ICD-10-CM | POA: Diagnosis not present

## 2021-10-10 DIAGNOSIS — R519 Headache, unspecified: Secondary | ICD-10-CM | POA: Insufficient documentation

## 2021-10-10 DIAGNOSIS — R112 Nausea with vomiting, unspecified: Secondary | ICD-10-CM | POA: Diagnosis not present

## 2021-10-10 DIAGNOSIS — M79641 Pain in right hand: Secondary | ICD-10-CM | POA: Diagnosis not present

## 2021-10-10 DIAGNOSIS — B349 Viral infection, unspecified: Secondary | ICD-10-CM

## 2021-10-10 DIAGNOSIS — M791 Myalgia, unspecified site: Secondary | ICD-10-CM | POA: Diagnosis not present

## 2021-10-10 DIAGNOSIS — M7918 Myalgia, other site: Secondary | ICD-10-CM | POA: Insufficient documentation

## 2021-10-10 DIAGNOSIS — F17210 Nicotine dependence, cigarettes, uncomplicated: Secondary | ICD-10-CM | POA: Diagnosis not present

## 2021-10-10 DIAGNOSIS — B341 Enterovirus infection, unspecified: Secondary | ICD-10-CM | POA: Diagnosis not present

## 2021-10-10 DIAGNOSIS — J101 Influenza due to other identified influenza virus with other respiratory manifestations: Secondary | ICD-10-CM | POA: Diagnosis not present

## 2021-10-10 DIAGNOSIS — Z20822 Contact with and (suspected) exposure to covid-19: Secondary | ICD-10-CM | POA: Diagnosis not present

## 2021-10-10 DIAGNOSIS — J111 Influenza due to unidentified influenza virus with other respiratory manifestations: Secondary | ICD-10-CM

## 2021-10-10 DIAGNOSIS — R110 Nausea: Secondary | ICD-10-CM

## 2021-10-10 LAB — RESP PANEL BY RT-PCR (FLU A&B, COVID) ARPGX2
Influenza A by PCR: NEGATIVE
Influenza B by PCR: NEGATIVE
SARS Coronavirus 2 by RT PCR: NEGATIVE

## 2021-10-10 MED ORDER — ONDANSETRON 4 MG PO TBDP
4.0000 mg | ORAL_TABLET | Freq: Once | ORAL | Status: AC
  Administered 2021-10-10: 10:00:00 4 mg via ORAL
  Filled 2021-10-10: qty 1

## 2021-10-10 MED ORDER — PROCHLORPERAZINE MALEATE 10 MG PO TABS: 10.0000 mg | ORAL_TABLET | Freq: Two times a day (BID) | ORAL | 0 refills | Status: AC | PRN

## 2021-10-10 MED ORDER — KETOROLAC TROMETHAMINE 60 MG/2ML IM SOLN
60.0000 mg | Freq: Once | INTRAMUSCULAR | Status: AC
  Administered 2021-10-10: 12:00:00 60 mg via INTRAMUSCULAR
  Filled 2021-10-10: qty 2

## 2021-10-10 MED ORDER — ACETAMINOPHEN 325 MG PO TABS
650.0000 mg | ORAL_TABLET | Freq: Once | ORAL | Status: AC
  Administered 2021-10-10: 10:00:00 650 mg via ORAL
  Filled 2021-10-10: qty 2

## 2021-10-10 MED ORDER — ONDANSETRON 4 MG PO TBDP: 4.0000 mg | ORAL_TABLET | Freq: Three times a day (TID) | ORAL | 0 refills | Status: AC | PRN

## 2021-10-10 NOTE — ED Provider Notes (Signed)
[facility] EMERGENCY DEPARTMENT Provider Note   CSN: [id number] Arrival date & time: 10/10/21  [id number]     History Chief Complaint  Patient presents with   Headache   Wrist Pain    Tristan Barajas is a 45 y.o. male.  HPI     Presents with concern for nausea, headache, throat scratchiness, mild cough, shortness of breath, no known fevers, fatigue. Does not usually get headaches, 7/10.  Feel nausea, no vomiting.  No diarrhea.  Started feeling like this Friday night-Sat AM.  Daughters bball teammate tested positive for covid, daughter has also been sick [place] 2 weeks ago, has had continued pain right wrist, located bilaterally wrist and worse on ulnar side, pain with movements adduction wrist, wrist extension/stretching, difficulty making fist   History reviewed. No pertinent past medical history.  There are no problems to display for this patient.   Past Surgical History:  Procedure Laterality Date   ANKLE SURGERY Right 1997       History reviewed. No pertinent family history.  Social History   Tobacco Use   Smoking status: Every Day    Packs/day: 0.25    Types: Cigarettes   Smokeless tobacco: Never  Vaping Use   Vaping Use: Never used  Substance Use Topics   Alcohol use: No   Drug use: No    Home Medications Prior to Admission medications   Medication Sig Start Date End Date Taking? Authorizing Provider  ondansetron (ZOFRAN-ODT) 4 MG disintegrating tablet Take 1 tablet (4 mg total) by mouth every 8 (eight) hours as needed for nausea or vomiting. 10/10/21  Yes Gareth Morgan, MD  prochlorperazine (COMPAZINE) 10 MG tablet Take 1 tablet (10 mg total) by mouth 2 (two) times daily as needed for nausea or vomiting (or headache, can be taken with benadryl 25mg  for headache and nausea). 10/10/21  Yes Gareth Morgan, MD  acetaminophen (TYLENOL) 500 MG tablet Take 500 mg by mouth every 6 (six) hours as needed for moderate pain.    [provider]   cetirizine (ZYRTEC) 10 MG tablet Take 10 mg by mouth daily as needed for allergies.    [provider]    Allergies    Patient has no known allergies.  Review of Systems   Review of Systems  Constitutional:  Positive for appetite change and fatigue. Negative for fever.  HENT:  Negative for rhinorrhea and sore throat (scratchiness).   Eyes:  Negative for visual disturbance.  Respiratory:  Positive for cough and shortness of breath.   Cardiovascular:  Negative for chest pain.  Gastrointestinal:  Positive for nausea. Negative for abdominal pain, diarrhea and vomiting.  Genitourinary:  Negative for difficulty urinating and dysuria.  Musculoskeletal:  Positive for arthralgias and myalgias. Negative for back pain and neck stiffness.  Skin:  Negative for rash.  Neurological:  Positive for headaches. Negative for syncope, weakness and numbness.   Physical Exam Updated Vital Signs BP 111/62    Pulse 86    Temp 99.1 F (37.3 C) (Oral)    Resp 18    Ht 5\' 11"  (1.803 m)    Wt 94.8 kg    SpO2 96%    BMI 29.15 kg/m   Physical Exam Vitals and nursing note reviewed.  Constitutional:      General: He is not in acute distress.    Appearance: He is well-developed. He is not diaphoretic.  HENT:     Head: Normocephalic and atraumatic.  Eyes:     Conjunctiva/sclera:  Conjunctivae normal.  Cardiovascular:     Rate and Rhythm: Normal rate and regular rhythm.     Heart sounds: Normal heart sounds. No murmur heard.   No friction rub. No gallop.  Pulmonary:     Effort: Pulmonary effort is normal. No respiratory distress.     Breath sounds: Normal breath sounds. No wheezing or rales.  Abdominal:     General: There is no distension.     Palpations: Abdomen is soft.     Tenderness: There is no abdominal tenderness. There is no guarding.  Musculoskeletal:        General: Tenderness (ulnar side right wrist, no snuff box tenderness, pain with extension, adduction wrist, weakness with grip  strength) present.     Cervical back: Normal range of motion.  Skin:    General: Skin is warm and dry.  Neurological:     Mental Status: He is alert and oriented to person, place, and time.    ED Results / Procedures / Treatments   Labs (all labs ordered are listed, but only abnormal results are displayed) Labs Reviewed  RESP PANEL BY RT-PCR (FLU A&B, COVID) [id number]    EKG None  Radiology DG Wrist Complete Right  Result Date: 10/10/2021 CLINICAL DATA:  Pain after fall. EXAM: RIGHT WRIST - COMPLETE 3+ VIEW COMPARISON:  None. FINDINGS: There is no evidence of fracture or dislocation. There is no evidence of arthropathy or other focal bone abnormality. Soft tissues are unremarkable. IMPRESSION: Negative. Electronically Signed   By: Dorise Bullion III M.D.   On: 10/10/2021 10:25   DG Hand Complete Right  Result Date: 10/10/2021 CLINICAL DATA:  Right hand pain after fall EXAM: RIGHT HAND - COMPLETE 3+ VIEW COMPARISON:  None. FINDINGS: There is no evidence of fracture or dislocation. There is no evidence of arthropathy or other focal bone abnormality. Soft tissues are unremarkable. IMPRESSION: Negative. Electronically Signed   By: Keane Police D.O.   On: 10/10/2021 10:25    Procedures Procedures   Medications Ordered in ED Medications  ondansetron (ZOFRAN-ODT) disintegrating tablet 4 mg (4 mg Oral Given 10/10/21 1008)  acetaminophen (TYLENOL) tablet 650 mg (650 mg Oral Given 10/10/21 1008)  ketorolac (TORADOL) injection 60 mg (60 mg Intramuscular Given 10/10/21 1132)    ED Course  I have reviewed the triage vital signs and the nursing notes.  Pertinent labs & imaging results that were available during my care of the patient were reviewed by me and considered in my medical decision making (see chart for details).    MDM Rules/Calculators/A&P                           45yo male presents with concern for cough, headache, nausea, fatigue.  Sick contacts with COVID 19, symptoms  may be consistent with this but COVID/flu negative. Given combination of symptoms, suspect likely viral etiology. Do not see signs of pneumona, bacterial throat infection, meningitis, intraabdominal infection.  Recommend continued supportive care, compazine for headache/nausea, zofran if nausea and vomiting and cannot keep down compazine.  Given toradol in ED.  Also has wrist pain from fall 2 weeks ago. No fracture or dislocation> Normal pulses. Will refer to hand surgery for evaluation of tendonitis or other ligamentous injury. Patient discharged in stable condition with understanding of reasons to return.        Final Clinical Impression(s) / ED Diagnoses Final diagnoses:  Viral syndrome  Nausea  Acute nonintractable headache, unspecified headache  type  Myalgia  Other fatigue  Right wrist pain  Influenza-like illness    Rx / DC Orders ED Discharge Orders          Ordered    prochlorperazine (COMPAZINE) 10 MG tablet  2 times daily PRN        10/10/21 1123    ondansetron (ZOFRAN-ODT) 4 MG disintegrating tablet  Every 8 hours PRN        10/10/21 1123             Alvira Monday, MD 10/10/21 2230

## 2021-10-10 NOTE — ED Notes (Signed)
Pt discharged to home. Discharge instructions have been discussed with patient and/or family members. Pt verbally acknowledges understanding d/c instructions, and endorses comprehension to checkout at registration before leaving.  °

## 2021-10-10 NOTE — ED Triage Notes (Signed)
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 States also fell onto right wrist 2 weeks ago, wants imaging as the pain isnt better.

## 2021-10-10 NOTE — ED Notes (Signed)
Pt returned from xray, ambulatory, no s/s of distress

## 2021-10-15 ENCOUNTER — Ambulatory Visit (INDEPENDENT_AMBULATORY_CARE_PROVIDER_SITE_OTHER): Payer: BC Managed Care – PPO | Admitting: Orthopedic Surgery

## 2021-10-15 ENCOUNTER — Encounter: Payer: Self-pay | Admitting: Orthopedic Surgery

## 2021-10-15 ENCOUNTER — Other Ambulatory Visit: Payer: Self-pay

## 2021-10-15 DIAGNOSIS — M25531 Pain in right wrist: Secondary | ICD-10-CM | POA: Insufficient documentation

## 2021-10-15 MED ORDER — MELOXICAM 7.5 MG PO TABS: 7.5000 mg | ORAL_TABLET | Freq: Every day | ORAL | 0 refills | Status: AC

## 2021-10-15 NOTE — Progress Notes (Signed)
Office Visit Note   Patient: Tristan Barajas           Date of Birth: January 03, 1976           MRN: [id number] Visit Date: 10/15/2021              Requested by: No referring provider defined for this encounter. PCP: Patient, No Pcp Per (Inactive)   Assessment & Plan: Visit Diagnoses:  1. Pain in right wrist     Plan: Discussed with patient that his pain is somewhat diffuse and difficult to pin down a particular location.  He does describe ulnar sided pain that could be related to a TFCC injury or ulnar sided sprain.  He has some snuffbox tenderness but no evidence of scaphoid fracture on x-rays.  He has some radiographic evidence of DRUJ arthritis but no instability on exam.  We will try a course of immobilization with oral NSAIDs.  I'll see him back in several weeks to see if he's improved.   Follow-Up Instructions: No follow-ups on file.   Orders:  No orders of the defined types were placed in this encounter.  No orders of the defined types were placed in this encounter.     Procedures: No procedures performed   Clinical Data: No additional findings.   Subjective: Chief Complaint  Patient presents with   Right Hand - New Patient (Initial Visit)    This is a 45 year old right-hand-dominant male who presents with 2 weeks of right wrist pain.  He is playing full on the backyard with his kids when he fell and thinks he had a hyperflexion type injury to the right wrist.  He since then has increased swelling and inability make a complete fist.  He is having trouble lifting weight over 5 or 10 pounds at work.  He has been taking over-the-counter Tylenol for pain.  He denies any previous injury to this wrist.  He was recently seen in the ER on 1218 for general malaise with headache and this wrist pain.  X-rays at that time were negative.  She describes pain about the radial and ulnar sides of the wrist.  His pain is worse with heavy lifting.  He has minimal pain with pronation  supination of the forearm.  He is able to make a complete fist today but notes decree strength compared to the contralateral side.   Review of Systems   Objective: Vital Signs: There were no vitals taken for this visit.  Physical Exam Constitutional:      Appearance: Normal appearance.  Cardiovascular:     Rate and Rhythm: Normal rate.     Pulses: Normal pulses.  Pulmonary:     Effort: Pulmonary effort is normal.  Skin:    General: Skin is warm and dry.     Capillary Refill: Capillary refill takes less than 2 seconds.  Neurological:     Mental Status: He is alert.    Right Hand Exam   Tenderness  Right hand tenderness location: TTP at fovea, dorsal ulnar head, SL interval, and snuffbox.  Mild swelling of wrist compared to contralateral side.  Range of Motion  The patient has normal right wrist ROM.   Other  Erythema: absent Sensation: normal Pulse: present  Comments:  Able to make complete fist but weak compared to contralateral side.  + Foveal tenderness.  No DRUJ instability.  No ECU instability.  Normal motion at 4th/5th [facility] joint w/out evidence of instability.  Pain w/ dart throwers motion.  Specialty Comments:  No specialty comments available.  Imaging: 3 views of both the right hand and right wrist were taken on 1218 in the emergency department and are reviewed interpreted by me today.  Do not demonstrate any evidence of fracture or subluxation.  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  There are otherwise no significant degenerative changes present.   PMFS History: Patient Active Problem List   Diagnosis Date Noted   Pain in right wrist 10/15/2021   History reviewed. No pertinent past medical history.  History reviewed. No pertinent family history.  Past Surgical History:  Procedure Laterality Date   ANKLE SURGERY Right 1997   Social History   Occupational History   Not  on file  Tobacco Use   Smoking status: Every Day    Packs/day: 0.25    Types: Cigarettes   Smokeless tobacco: Never  Vaping Use   Vaping Use: Never used  Substance and Sexual Activity   Alcohol use: No   Drug use: No   Sexual activity: Not on file

## 2021-11-05 ENCOUNTER — Ambulatory Visit (INDEPENDENT_AMBULATORY_CARE_PROVIDER_SITE_OTHER): Payer: BC Managed Care – PPO | Admitting: Orthopedic Surgery

## 2021-11-05 ENCOUNTER — Other Ambulatory Visit: Payer: Self-pay

## 2021-11-05 DIAGNOSIS — M25531 Pain in right wrist: Secondary | ICD-10-CM | POA: Diagnosis not present

## 2021-11-05 NOTE — Progress Notes (Signed)
° °  Office Visit Note   Patient: Tristan Barajas           Date of Birth: [birth year]           MRN: [id number] Visit Date: 11/05/2021              Requested by: No referring provider defined for this encounter. PCP: Patient, No Pcp Per (Inactive)   Assessment & Plan: Visit Diagnoses:  1. Pain in right wrist     Plan: Patient is doing much better at this point after a period of immobilization and oral NSAID use.  He still has some pain w/ terminal extension but this is much better than previous.  He wants to continue with immobilization, activity modification, and oral NSAIDs for another two weeks.  After that point, he can likely return to work and slowly return to working out.   Follow-Up Instructions: No follow-ups on file.   Orders:  No orders of the defined types were placed in this encounter.  No orders of the defined types were placed in this encounter.     Procedures: No procedures performed   Clinical Data: No additional findings.   Subjective: Chief Complaint  Patient presents with   Right Wrist - Follow-up    This is a 46 year old right-hand-dominant male who presents for follow up of right wrist pain.  It was previously poorly localized but now seems to be mostly dorrsal with resisted wrist extension. He has been in a removable wrist brace and taking oral NSAIDs for the last 3 weeks and has noticed significant improvement.  He now only has mild pain w/ terminal extension.     Review of Systems   Objective: Vital Signs: There were no vitals taken for this visit.  Physical Exam  Right Hand Exam   Tenderness  The patient is experiencing no tenderness.   Other  Erythema: absent Sensation: normal Pulse: present  Comments:  No pain w/ PROM or AROM of wrist.  Only has pain w/ resisted wrist extension.  Full pronation/supination.      Specialty Comments:  No specialty comments available.  Imaging: No results found.   PMFS History: Patient  Active Problem List   Diagnosis Date Noted   Pain in right wrist 10/15/2021   No past medical history on file.  No family history on file.  Past Surgical History:  Procedure Laterality Date   ANKLE SURGERY Right 1997   Social History   Occupational History   Not on file  Tobacco Use   Smoking status: Every Day    Packs/day: 0.25    Types: Cigarettes   Smokeless tobacco: Never  Vaping Use   Vaping Use: Never used  Substance and Sexual Activity   Alcohol use: No   Drug use: No   Sexual activity: Not on file

## 2021-11-19 ENCOUNTER — Encounter: Payer: Self-pay | Admitting: Orthopedic Surgery

## 2021-11-19 ENCOUNTER — Other Ambulatory Visit: Payer: Self-pay

## 2021-11-19 ENCOUNTER — Ambulatory Visit (INDEPENDENT_AMBULATORY_CARE_PROVIDER_SITE_OTHER): Payer: BC Managed Care – PPO | Admitting: Orthopedic Surgery

## 2021-11-19 VITALS — BP 114/59 | HR 89

## 2021-11-19 DIAGNOSIS — M25531 Pain in right wrist: Secondary | ICD-10-CM

## 2021-11-19 NOTE — Progress Notes (Signed)
° °  Office Visit Note   Patient: Tristan Barajas           Date of Birth: 1976/10/22           MRN: [id number] Visit Date: 11/19/2021              Requested by: No referring provider defined for this encounter. PCP: Patient, No Pcp Per (Inactive)   Assessment & Plan: Visit Diagnoses:  1. Pain in right wrist     Plan: Patient's wrist pain has essentially resolved after a trial of bracing and oral NSAIDs.  He has no complaints today.  He is ready to return to work with no restrictions.   Follow-Up Instructions: No follow-ups on file.   Orders:  No orders of the defined types were placed in this encounter.  No orders of the defined types were placed in this encounter.     Procedures: No procedures performed   Clinical Data: No additional findings.   Subjective: Chief Complaint  Patient presents with   Right Wrist - Follow-up    This is a 46 year old right-hand-dominant male who presents for follow up of right wrist pain.  His pain started and December.  His pain was previously worse with terminal extension.  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  He has been in a brace full-time.  He has no complaints today.  He has full and painless range of motion.  Strength is improved.  He has no pain with terminal extension today.  He is overall very happy with his progress and wants to return to work.    Review of Systems   Objective: Vital Signs: BP (!) 114/59 (BP Location: Left Arm, Patient Position: Sitting, Cuff Size: Large)    Pulse 89    SpO2 97%   Physical Exam  Right Hand Exam   Tenderness  The patient is experiencing no tenderness.   Range of Motion  The patient has normal right wrist ROM.   Muscle Strength  The patient has normal right wrist strength.  Other  Erythema: absent Sensation: normal Pulse: present     Specialty Comments:  No specialty comments available.  Imaging: No results  found.   PMFS History: Patient Active Problem List   Diagnosis Date Noted   Pain in right wrist 10/15/2021   History reviewed. No pertinent past medical history.  History reviewed. No pertinent family history.  Past Surgical History:  Procedure Laterality Date   ANKLE SURGERY Right 1997   Social History   Occupational History   Not on file  Tobacco Use   Smoking status: Every Day    Packs/day: 0.25    Types: Cigarettes   Smokeless tobacco: Never  Vaping Use   Vaping Use: Never used  Substance and Sexual Activity   Alcohol use: No   Drug use: No   Sexual activity: Not on file

## 2022-02-16 DIAGNOSIS — S299XXA Unspecified injury of thorax, initial encounter: Secondary | ICD-10-CM | POA: Diagnosis not present

## 2022-03-03 ENCOUNTER — Other Ambulatory Visit: Payer: Self-pay

## 2022-03-03 ENCOUNTER — Emergency Department (HOSPITAL_BASED_OUTPATIENT_CLINIC_OR_DEPARTMENT_OTHER)
Admission: EM | Admit: 2022-03-03 | Discharge: 2022-03-03 | Disposition: A | Discharge status: Home / Self Care | Payer: BC Managed Care – PPO | Attending: Emergency Medicine | Admitting: Emergency Medicine

## 2022-03-03 ENCOUNTER — Emergency Department (HOSPITAL_BASED_OUTPATIENT_CLINIC_OR_DEPARTMENT_OTHER): Payer: BC Managed Care – PPO

## 2022-03-03 DIAGNOSIS — R0781 Pleurodynia: Secondary | ICD-10-CM

## 2022-03-03 DIAGNOSIS — R0789 Other chest pain: Secondary | ICD-10-CM | POA: Diagnosis not present

## 2022-03-03 DIAGNOSIS — R079 Chest pain, unspecified: Secondary | ICD-10-CM | POA: Diagnosis not present

## 2022-03-03 MED ORDER — IBUPROFEN 800 MG PO TABS: 800.0000 mg | ORAL_TABLET | Freq: Three times a day (TID) | ORAL | 0 refills | Status: AC | PRN

## 2022-03-03 NOTE — ED Provider Notes (Signed)
?[facility] EMERGENCY DEPARTMENT ?Provider Note ? ? ?CSN: [id number] ?Arrival date & time: 03/03/22  1110 ? ?  ? ?History ? ?Chief Complaint  ?Patient presents with  ? Rib Injury  ? ? ?Tristan Barajas is a 46 y.o. male. ? ?Patient reports that he was at a bar around the 14th or 15 April and had a syncopal episode.  When he had the syncopal episode he fell and hit a wall.  He was seen at the hospital and then evaluated later at [facility] for continued rib pain.  He reports that x-rays showed no fractures but he is continuing to have pain and having difficulty working because of the pain.  Reports no shortness of breath or other chest pain.  Point tenderness on his left ribs.  Pain is reproducible.  Reports there were some "bumps" but they have resolved. ? ? ?  ? ?Home Medications ?Prior to Admission medications   ?Medication Sig Start Date End Date Taking? Authorizing Provider  ?ibuprofen (ADVIL) 800 MG tablet Take 1 tablet (800 mg total) by mouth every 8 (eight) hours as needed. 03/03/22  Yes Gifford Shave, MD  ?acetaminophen (TYLENOL) 500 MG tablet Take 500 mg by mouth every 6 (six) hours as needed for moderate pain.    [provider]  ?cetirizine (ZYRTEC) 10 MG tablet Take 10 mg by mouth daily as needed for allergies.    [provider]  ?ondansetron (ZOFRAN-ODT) 4 MG disintegrating tablet Take 1 tablet (4 mg total) by mouth every 8 (eight) hours as needed for nausea or vomiting. 10/10/21   Gareth Morgan, MD  ?prochlorperazine (COMPAZINE) 10 MG tablet Take 1 tablet (10 mg total) by mouth 2 (two) times daily as needed for nausea or vomiting (or headache, can be taken with benadryl 25mg  for headache and nausea). 10/10/21   Gareth Morgan, MD  ?   ? ?Allergies    ?Patient has no known allergies.   ? ?Review of Systems   ?Review of Systems  ?Constitutional:  Negative for chills and fever.  ?HENT:  Negative for congestion, ear pain and sore throat.   ?Respiratory:  Negative for  cough and shortness of breath.   ?Cardiovascular:  Positive for chest pain (Rib pain).  ?Gastrointestinal:  Negative for abdominal pain and vomiting.  ?Genitourinary:  Negative for dysuria and hematuria.  ?Skin:  Negative for color change and rash.  ?Neurological:  Negative for seizures and syncope.  ?All other systems reviewed and are negative. ? ?Physical Exam ?Updated Vital Signs ?BP (!) 124/110 (BP Location: Left Arm)   Pulse 78   Temp 98.1 ?F (36.7 ?C) (Oral)   Resp 16   Ht 5\' 11"  (1.803 m)   Wt 95.3 kg   SpO2 97%   BMI 29.29 kg/m?  ?Physical Exam ?HENT:  ?   Head: Normocephalic and atraumatic.  ?   Right Ear: External ear normal.  ?   Left Ear: External ear normal.  ?   Nose: Nose normal.  ?   Mouth/Throat:  ?   Mouth: Mucous membranes are moist.  ?Cardiovascular:  ?   Rate and Rhythm: Normal rate.  ?Pulmonary:  ?   Effort: Pulmonary effort is normal.  ?Abdominal:  ?   General: Abdomen is flat.  ?Musculoskeletal:     ?   General: Normal range of motion.  ?   Cervical back: Normal range of motion and neck supple.  ?   Comments: Tenderness to palpation along seventh and eighth ribs  on left side  ?Skin: ?   General: Skin is warm.  ?Neurological:  ?   Mental Status: He is alert.  ? ? ?ED Results / Procedures / Treatments   ?Labs ?(all labs ordered are listed, but only abnormal results are displayed) ?Labs Reviewed - No data to display ? ?EKG ?None ? ?Radiology ?DG Ribs Unilateral W/Chest Left ? ?Result Date: 03/03/2022 ?CLINICAL DATA:  Left chest pain, previous fall EXAM: LEFT RIBS AND CHEST - 4 VIEW COMPARISON:  02/16/2022 FINDINGS: Cardiac size is within normal limits. Lung fields are clear of any infiltrates or pulmonary edema. There is no pleural effusion or pneumothorax. There are no displaced fractures in the left ribs. A skin marker is noted lateral to the lower left ribs marking the area of patient's symptoms. IMPRESSION: No recent fractures are seen in the left ribs. No active disease is seen in the  chest. Electronically Signed   By: Elmer Picker M.D.   On: 03/03/2022 12:20   ? ?Procedures ?Procedures  ? ? ?Medications Ordered in ED ?Medications - No data to display ? ?ED Course/ Medical Decision Making/ A&P ?  ?                        ?Medical Decision Making ?Patient presenting with approximately 1 month history of left-sided rib pain after falling and hitting a wall.  Previously seen by urgent care who performed x-ray showing no acute fractures.  Repeat x-ray today showing no fractures.  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  Physical exam showing tenderness along seventh and eighth ribs on the left side.  He is scheduled to see a new PCP on 5/24.  Prescription sent for 800 mg ibuprofen.  Return precautions and follow-up recommendations given.  Patient discharged home. ? ?Amount and/or Complexity of Data Reviewed ?Radiology: ordered. ? ? ? ?Final Clinical Impression(s) / ED Diagnoses ?Final diagnoses:  ?Rib pain  ? ? ?Rx / DC Orders ?ED Discharge Orders   ? ?      Ordered  ?  ibuprofen (ADVIL) 800 MG tablet  Every 8 hours PRN       ? 03/03/22 1224  ? ?  ?  ? ?  ? ? ?  ?Gifford Shave, MD ?03/03/22 1237 ? ?  ?Isla Pence, MD ?03/03/22 1306 ? ?

## 2022-03-03 NOTE — ED Triage Notes (Signed)
Left sided rib pain from previous fall that happened 4/16 ?Patient has been seen by pcp and was not happy with the answer of a bruised rib and is here for a second opinion. ?

## 2022-03-03 NOTE — Discharge Instructions (Addendum)
It was a pleasure taking care of you today.  I am sorry you are still having pain with your ribs.We got x-rays today which did not show any fracture but that does not mean that there is not a small wound present.  He also may have a bruise on your ribs.  This will take some time to recover.  I sent a prescription for 800 mg ibuprofen which she can take every 8 hours but only take it if you need it.Regarding work I will give you a note to be out until the 16th.  Please be sure to follow-up with your new primary care provider.  If you have any issues please return.  I hope you have a wonderful day! ?

## 2022-05-06 ENCOUNTER — Ambulatory Visit: Payer: BC Managed Care – PPO | Admitting: Family Medicine

## 2022-12-15 IMAGING — DX DG WRIST COMPLETE 3+V*R*
4 series · 4 of 4 positions shown · non-contrast
Comparison: None.

CLINICAL DATA: Pain after fall.

EXAM:
RIGHT WRIST - COMPLETE 3+ VIEW

[wrist pa]
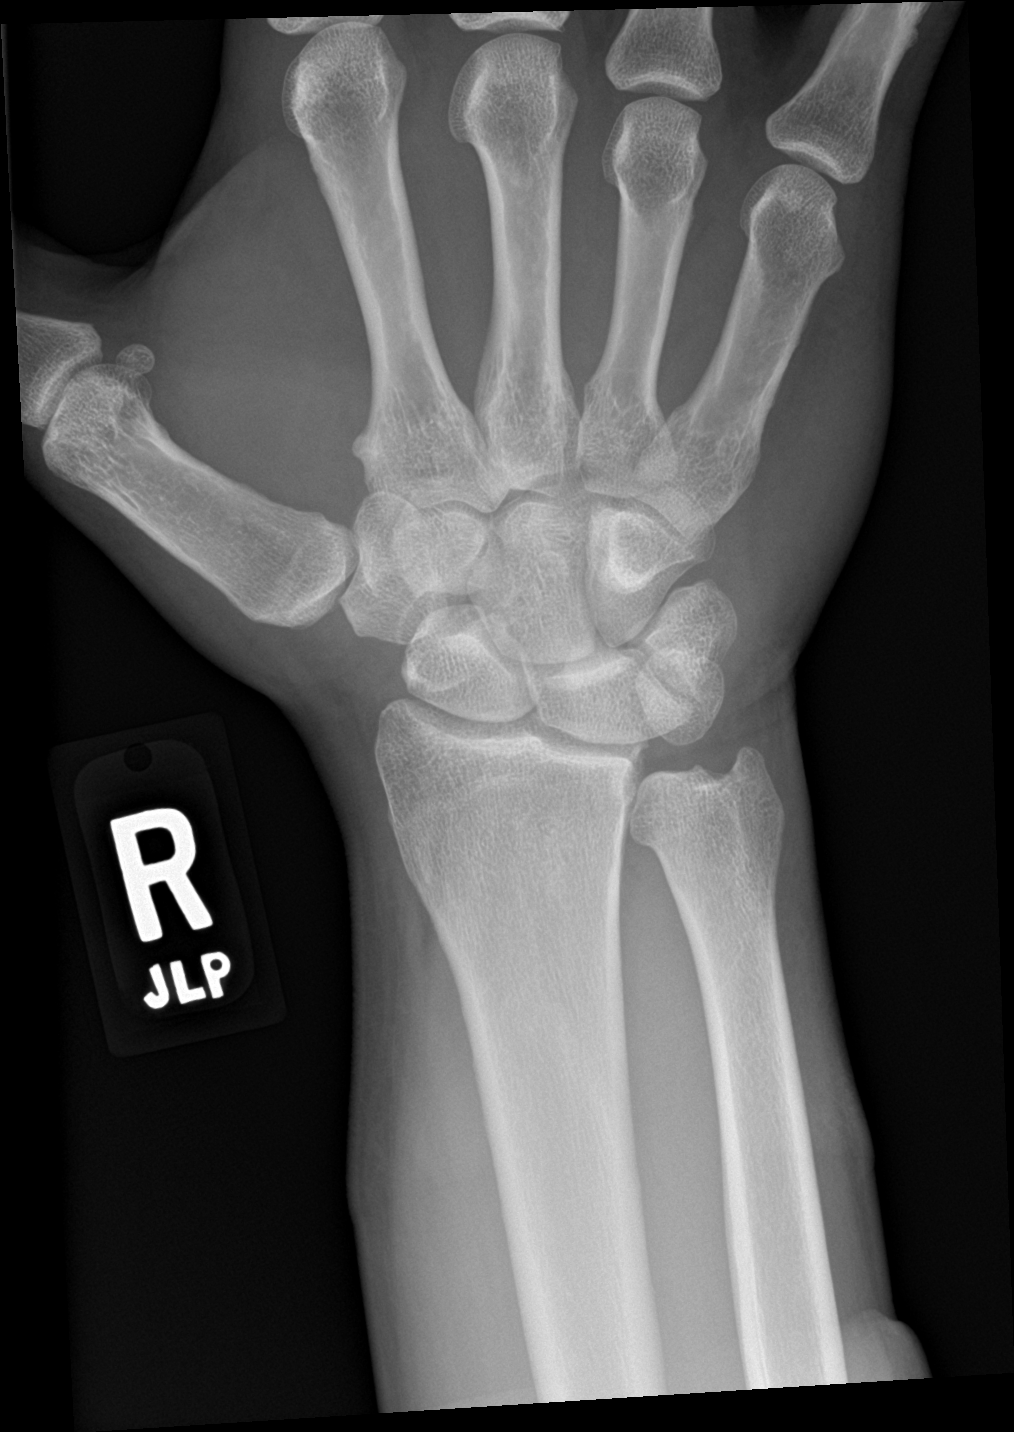

[wrist obl]
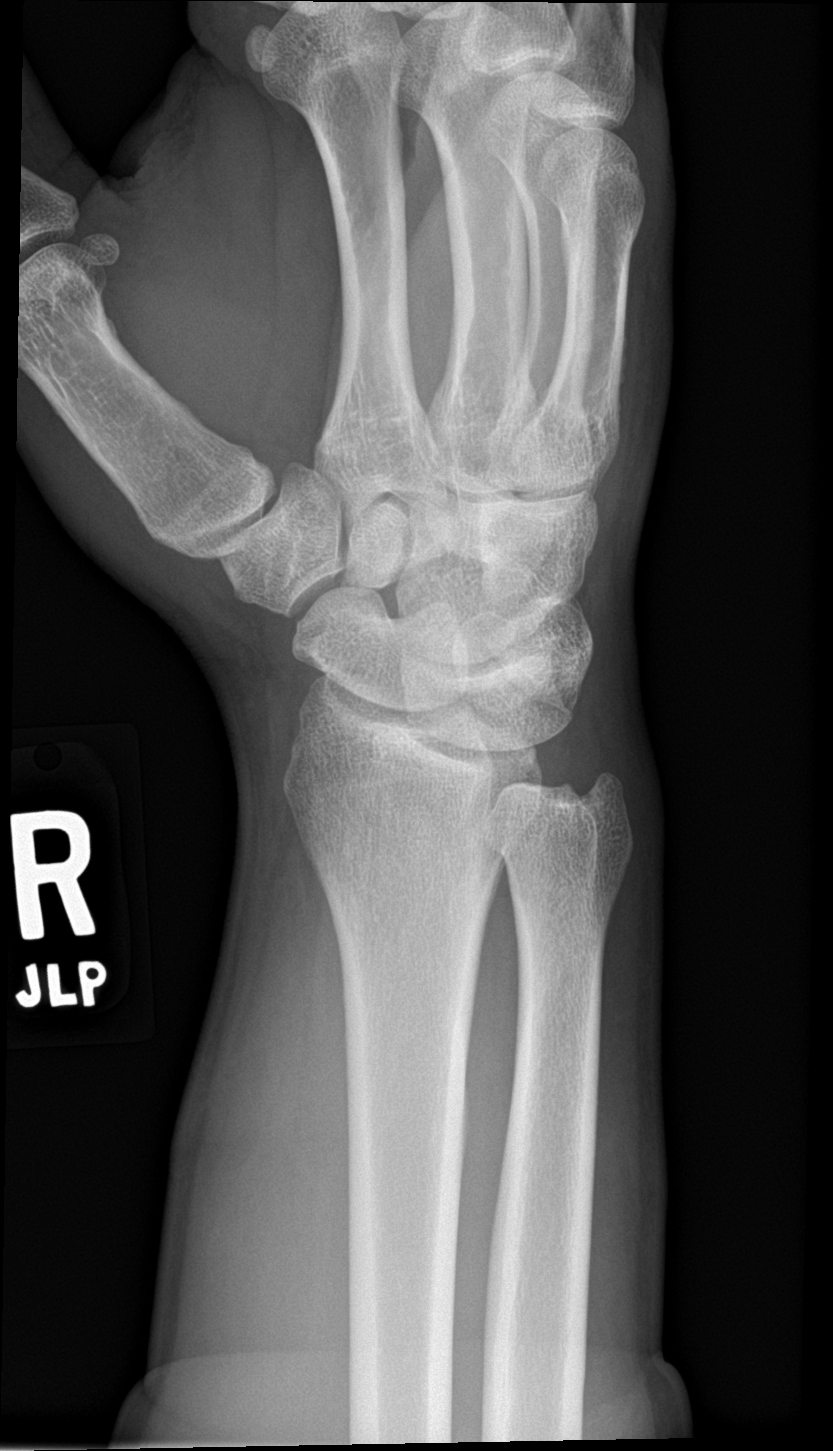

[wrist lat]
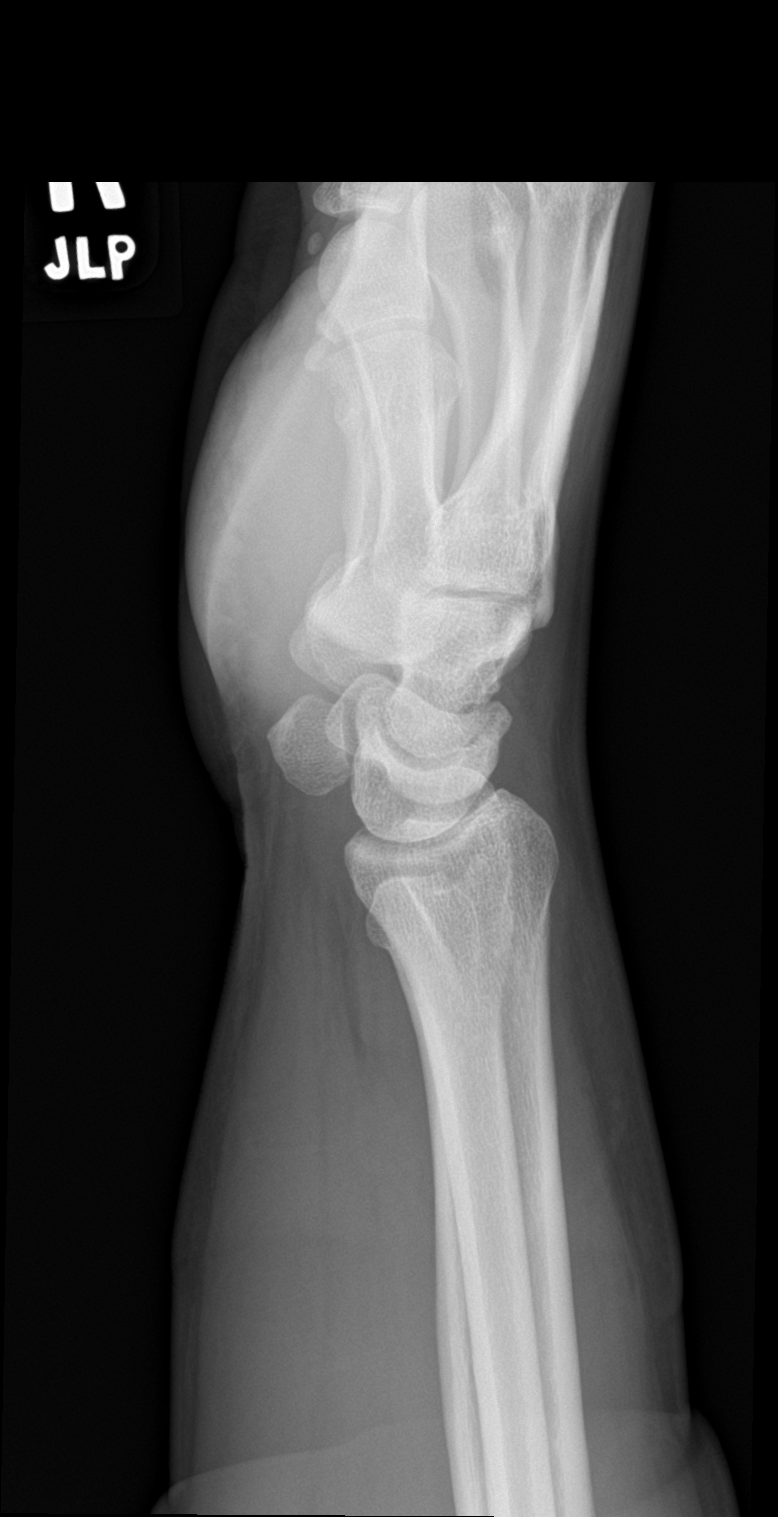

[wrist navicular]
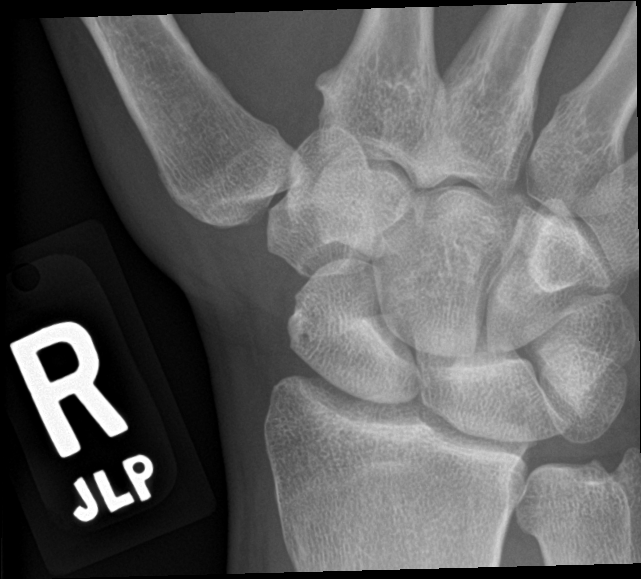

[4 of 4 positions shown; findings below may reference images not displayed]

FINDINGS: There is no evidence of fracture or dislocation. There is no
evidence of arthropathy or other focal bone abnormality. Soft
tissues are unremarkable.
IMPRESSION: Negative.

## 2023-03-21 ENCOUNTER — Other Ambulatory Visit: Payer: Self-pay
